# Patient Record
Sex: Female | Born: 1975 | Race: Asian | Hispanic: No | Marital: Married | Smoking: Never smoker
Health system: Southern US, Community
[De-identification: ages and names within clinical notes are randomized; demographics above are authoritative.]

## PROBLEM LIST (undated history)

## (undated) DIAGNOSIS — K219 Gastro-esophageal reflux disease without esophagitis: Secondary | ICD-10-CM

## (undated) DIAGNOSIS — A6000 Herpesviral infection of urogenital system, unspecified: Secondary | ICD-10-CM

## (undated) DIAGNOSIS — IMO0002 Reserved for concepts with insufficient information to code with codable children: Secondary | ICD-10-CM

## (undated) DIAGNOSIS — D62 Acute posthemorrhagic anemia: Secondary | ICD-10-CM

## (undated) DIAGNOSIS — Z9851 Tubal ligation status: Secondary | ICD-10-CM

## (undated) DIAGNOSIS — O24419 Gestational diabetes mellitus in pregnancy, unspecified control: Secondary | ICD-10-CM

## (undated) DIAGNOSIS — O09529 Supervision of elderly multigravida, unspecified trimester: Secondary | ICD-10-CM

## (undated) DIAGNOSIS — D649 Anemia, unspecified: Secondary | ICD-10-CM

## (undated) HISTORY — DX: Anemia, unspecified: D64.9

## (undated) HISTORY — DX: Herpesviral infection of urogenital system, unspecified: A60.00

## (undated) HISTORY — DX: Gestational diabetes mellitus in pregnancy, unspecified control: O24.419

## (undated) HISTORY — DX: Reserved for concepts with insufficient information to code with codable children: IMO0002

## (undated) HISTORY — DX: Gastro-esophageal reflux disease without esophagitis: K21.9

## (undated) HISTORY — PX: LEG SURGERY: SHX1003

## (undated) HISTORY — DX: Supervision of elderly multigravida, unspecified trimester: O09.529

---

## 2009-02-23 ENCOUNTER — Encounter: Admission: RE | Admit: 2009-02-23 | Discharge: 2009-02-23 | Payer: Self-pay | Admitting: Obstetrics & Gynecology

## 2009-05-01 ENCOUNTER — Inpatient Hospital Stay (HOSPITAL_COMMUNITY): Admission: AD | Admit: 2009-05-01 | Discharge: 2009-05-03 | Payer: Self-pay | Admitting: Obstetrics & Gynecology

## 2010-04-04 LAB — CBC
HCT: 28.8 % — ABNORMAL LOW (ref 36.0–46.0)
HCT: 36 % (ref 36.0–46.0)
Hemoglobin: 12 g/dL (ref 12.0–15.0)
Hemoglobin: 9.8 g/dL — ABNORMAL LOW (ref 12.0–15.0)
MCHC: 33.3 g/dL (ref 30.0–36.0)
MCV: 90 fL (ref 78.0–100.0)
MCV: 90.7 fL (ref 78.0–100.0)
Platelets: 261 10*3/uL (ref 150–400)
RDW: 14.6 % (ref 11.5–15.5)
RDW: 15.1 % (ref 11.5–15.5)

## 2010-04-04 LAB — GLUCOSE, CAPILLARY

## 2010-11-08 DIAGNOSIS — E119 Type 2 diabetes mellitus without complications: Secondary | ICD-10-CM | POA: Insufficient documentation

## 2010-12-20 LAB — OB RESULTS CONSOLE HEPATITIS B SURFACE ANTIGEN: Hepatitis B Surface Ag: NEGATIVE

## 2010-12-20 LAB — OB RESULTS CONSOLE RPR: RPR: NONREACTIVE

## 2010-12-20 LAB — OB RESULTS CONSOLE HIV ANTIBODY (ROUTINE TESTING): HIV: NONREACTIVE

## 2010-12-20 LAB — OB RESULTS CONSOLE RUBELLA ANTIBODY, IGM: Rubella: IMMUNE

## 2011-01-05 ENCOUNTER — Emergency Department (HOSPITAL_BASED_OUTPATIENT_CLINIC_OR_DEPARTMENT_OTHER)
Admission: EM | Admit: 2011-01-05 | Discharge: 2011-01-05 | Disposition: A | Discharge status: Home / Self Care | Payer: Managed Care, Other (non HMO) | Attending: Emergency Medicine | Admitting: Emergency Medicine

## 2011-01-05 ENCOUNTER — Encounter: Payer: Self-pay | Admitting: *Deleted

## 2011-01-05 DIAGNOSIS — E860 Dehydration: Secondary | ICD-10-CM

## 2011-01-05 DIAGNOSIS — R197 Diarrhea, unspecified: Secondary | ICD-10-CM | POA: Insufficient documentation

## 2011-01-05 DIAGNOSIS — R1110 Vomiting, unspecified: Secondary | ICD-10-CM

## 2011-01-05 DIAGNOSIS — O211 Hyperemesis gravidarum with metabolic disturbance: Secondary | ICD-10-CM | POA: Insufficient documentation

## 2011-01-05 LAB — CBC
HCT: 36.7 % (ref 36.0–46.0)
Hemoglobin: 12.6 g/dL (ref 12.0–15.0)
MCV: 86.2 fL (ref 78.0–100.0)
WBC: 14.6 10*3/uL — ABNORMAL HIGH (ref 4.0–10.5)

## 2011-01-05 LAB — DIFFERENTIAL
Lymphocytes Relative: 3 % — ABNORMAL LOW (ref 12–46)
Monocytes Absolute: 0.2 10*3/uL (ref 0.1–1.0)
Monocytes Relative: 2 % — ABNORMAL LOW (ref 3–12)
Neutro Abs: 13.9 10*3/uL — ABNORMAL HIGH (ref 1.7–7.7)

## 2011-01-05 LAB — BASIC METABOLIC PANEL
BUN: 12 mg/dL (ref 6–23)
CO2: 24 mEq/L (ref 19–32)
Chloride: 98 mEq/L (ref 96–112)
Creatinine, Ser: 0.5 mg/dL (ref 0.50–1.10)
Glucose, Bld: 134 mg/dL — ABNORMAL HIGH (ref 70–99)

## 2011-01-05 LAB — URINALYSIS, ROUTINE W REFLEX MICROSCOPIC
Bilirubin Urine: NEGATIVE
Glucose, UA: 100 mg/dL — AB
Hgb urine dipstick: NEGATIVE
Protein, ur: NEGATIVE mg/dL
Specific Gravity, Urine: 1.03 (ref 1.005–1.030)

## 2011-01-05 LAB — PREGNANCY, URINE: Preg Test, Ur: POSITIVE

## 2011-01-05 MED ORDER — ONDANSETRON HCL 4 MG/2ML IJ SOLN
4.0000 mg | Freq: Once | INTRAMUSCULAR | Status: AC
  Administered 2011-01-05: 4 mg via INTRAVENOUS
  Filled 2011-01-05: qty 2

## 2011-01-05 MED ORDER — SODIUM CHLORIDE 0.9 % IV BOLUS (SEPSIS)
1000.0000 mL | Freq: Once | INTRAVENOUS | Status: AC
  Administered 2011-01-05: 1000 mL via INTRAVENOUS

## 2011-01-05 MED ORDER — ONDANSETRON 4 MG PO TBDP: 4.0000 mg | ORAL_TABLET | Freq: Three times a day (TID) | ORAL | Status: AC | PRN

## 2011-01-05 MED ORDER — POTASSIUM CHLORIDE 20 MEQ/15ML (10%) PO LIQD
10.0000 meq | Freq: Once | ORAL | Status: AC
  Administered 2011-01-05: 13:00:00 via ORAL
  Filled 2011-01-05: qty 15

## 2011-01-05 NOTE — ED Notes (Signed)
Attempted to find fetal heart tones, unable to locate.  Also noted some discrepancy about EDC.

## 2011-01-05 NOTE — ED Notes (Signed)
Pt reports that yesterday she started having nausea/vomiting and diarrhea.  Pt is pregnant and was seen by her PCP(John Christell Constant) where an CBC showed an elevated WBC of 15.  Pt was sent here for evaluation.

## 2011-01-05 NOTE — ED Provider Notes (Addendum)
History     CSN: [id number]  Arrival date & time 01/05/11  1029   First MD Initiated Contact with Patient 01/05/11 1100      Chief Complaint  Patient presents with  . Nausea  . Diarrhea    (Consider location/radiation/quality/duration/timing/severity/associated sxs/prior treatment) HPI  35yoF G2P1 [redacted]w[redacted]d pregnant by LMP 10/1 pw nausea, vomiting, diarrhea. The patient states her symptoms began yesterday. She is having several episodes of watery diarrhea without blood. She states she also has nausea and vomiting which has been persistent throughout this pregnancy and not atypical. She has not attempted by mouth intake since yesterday secondary to her nausea and dry heaves now. She denies back pain, abdominal pain, vaginal discharge. Denies hematuria/dysuria/freq/urgency. Seen by her primary care doctor this morning there was some concern because her white blood cell count was elevated at that time she was referred to the emergency department for further workup and evaluation. She denies rhinorrhea, nasal congestion, headache, sore throat. She has body aches bilateral legs. Husband and her son with similar illness less than one week ago.    ED Notes, ED Provider Notes from 01/05/11 0000 to 01/05/11 10:53:03       Collier Bullock, RN 01/05/2011 10:46      Pt reports that yesterday she started having nausea/vomiting and diarrhea. Pt is pregnant and was seen by her PCP(John Christell Constant) where an CBC showed an elevated WBC of 15. Pt was sent here for evaluation.     Past Medical History  Diagnosis Date  . Diabetes mellitus     Past Surgical History  Procedure Date  . Leg surgery     History reviewed. No pertinent family history.  History  Substance Use Topics  . Smoking status: Never Smoker   . Smokeless tobacco: Not on file  . Alcohol Use: No    OB History    Grav Para Term Preterm Abortions TAB SAB Ect Mult Living   2 1              Review of Systems except as noted  HPI   Allergies  Review of patient's allergies indicates no known allergies.  Home Medications   Current Outpatient Rx  Name Route Sig Dispense Refill  . FAMOTIDINE 20 MG PO TABS Oral Take 20 mg by mouth 2 (two) times daily.      Marland Kitchen METFORMIN HCL 500 MG PO TABS Oral Take 500 mg by mouth 2 (two) times daily with a meal.        BP 114/74  Pulse 100  Temp(Src) 99.3 F (37.4 C) (Oral)  Resp 15  SpO2 99%  Physical Exam  Nursing note and vitals reviewed. Constitutional: She is oriented to person, place, and time. She appears well-developed.  HENT:  Head: Atraumatic.       Mm dry  Eyes: Conjunctivae and EOM are normal. Pupils are equal, round, and reactive to light.  Neck: Normal range of motion. Neck supple.  Cardiovascular: Regular rhythm, normal heart sounds and intact distal pulses.        tachycardic  Pulmonary/Chest: Effort normal and breath sounds normal. No respiratory distress. She has no wheezes. She has no rales.  Abdominal: Soft. She exhibits no distension. There is no tenderness. There is no rebound and no guarding.  Musculoskeletal: Normal range of motion. She exhibits no edema and no tenderness.  Neurological: She is alert and oriented to person, place, and time.  Skin: Skin is warm and dry. No rash noted.  Psychiatric:  She has a normal mood and affect.    ED Course  Procedures (including critical care time)  Labs Reviewed  URINALYSIS, ROUTINE W REFLEX MICROSCOPIC - Abnormal; Notable for the following:    Glucose, UA 100 (*)    Ketones, ur >80 (*)    All other components within normal limits  CBC - Abnormal; Notable for the following:    WBC 14.6 (*)    All other components within normal limits  DIFFERENTIAL - Abnormal; Notable for the following:    Neutrophils Relative 95 (*)    Neutro Abs 13.9 (*)    Lymphocytes Relative 3 (*)    Lymphs Abs 0.4 (*)    Monocytes Relative 2 (*)    All other components within normal limits  BASIC METABOLIC PANEL -  Abnormal; Notable for the following:    Potassium 3.4 (*)    Glucose, Bld 134 (*)    All other components within normal limits  PREGNANCY, URINE   No results found.   1. Diarrhea   2. Hyperemesis     MDM  Likely viral illness +/- mild hyperemesis. Her abdomen is soft without ttp/r/g. Will check electrolytes, U/A. IVF, zofran. Reassess. Anticipate discharge home.  Patient was able to urinate emergency department. After 1 L of fluid she continues to have greater than 80 ketones in her urine. +Leukocytosis, likely multifactorial-- pregnancy, vomiting, possible viral syndrome. She is tolerating by mouth intake and feeling better at this time. We'll give 2 more liters of IV fluid and she will be discharged home. Bedside ultrasound with IUP, YS, fetal pole. fetal heart rate 182. Family states that her rate was 177 approximately 2 weeks ago.    3:04 PM  patient continues to deny nausea. She tolerating by mouth intake. She has a followup appointment with her OB/GYN and ultrasound scheduled for next week. Patient advised to move up her appointment as needed. Precautions for return to the emergency department.      Forbes Cellar, MD 01/05/11 1505  Forbes Cellar, MD 01/05/11 [phone]

## 2011-01-05 NOTE — ED Notes (Signed)
Pt reports her nausea is better. Still unable to give a urine specimen.

## 2011-01-05 NOTE — ED Notes (Signed)
Pt given soup and crackers to eat

## 2011-01-16 NOTE — L&D Delivery Note (Signed)
Operative Delivery Note At 6:42 AM a viable and healthy female was delivered via .  Presentation: vertex; Position: Right,, Occiput,, Anterior; Station: +4.  Delivery of the head: spontaneous with gentle traction, [place] x one reduced     Second maneuver: ,  mcroberts Third maneuver: , Suprapubic pressure Fourth maneuver: ,  Woods Screw  Verbal consent: obtained from family.  APGAR: 8,9 ; weight pending.   Placenta status: spontaneous,intact .   Cord:  with the following complications: .  Cord pH: na  Anesthesia:  none Episiotomy: none Lacerations: none Suture Repair: na Est. Blood Loss (mL): 300  Mom to postpartum.  Baby to nursery-stable.  Kelly Mullen J 07/19/2011, 6:55 AM

## 2011-07-12 ENCOUNTER — Telehealth (HOSPITAL_COMMUNITY): Payer: Self-pay | Admitting: *Deleted

## 2011-07-12 NOTE — Telephone Encounter (Signed)
Preadmission screen  

## 2011-07-13 ENCOUNTER — Other Ambulatory Visit: Payer: Self-pay | Admitting: Obstetrics & Gynecology

## 2011-07-16 ENCOUNTER — Telehealth (HOSPITAL_COMMUNITY): Payer: Self-pay | Admitting: *Deleted

## 2011-07-16 ENCOUNTER — Encounter (HOSPITAL_COMMUNITY): Payer: Self-pay | Admitting: *Deleted

## 2011-07-16 NOTE — Telephone Encounter (Signed)
Preadmission screen  

## 2011-07-19 ENCOUNTER — Inpatient Hospital Stay (HOSPITAL_COMMUNITY)
Admission: AD | Admit: 2011-07-19 | Discharge: 2011-07-21 | DRG: 775 | Disposition: A | Discharge status: Home / Self Care | Payer: Managed Care, Other (non HMO) | Attending: Obstetrics and Gynecology | Admitting: Obstetrics and Gynecology

## 2011-07-19 ENCOUNTER — Encounter (HOSPITAL_COMMUNITY): Payer: Self-pay | Admitting: *Deleted

## 2011-07-19 DIAGNOSIS — O24419 Gestational diabetes mellitus in pregnancy, unspecified control: Secondary | ICD-10-CM

## 2011-07-19 DIAGNOSIS — D62 Acute posthemorrhagic anemia: Secondary | ICD-10-CM | POA: Diagnosis not present

## 2011-07-19 DIAGNOSIS — O9903 Anemia complicating the puerperium: Secondary | ICD-10-CM | POA: Diagnosis not present

## 2011-07-19 DIAGNOSIS — O99814 Abnormal glucose complicating childbirth: Principal | ICD-10-CM | POA: Diagnosis present

## 2011-07-19 DIAGNOSIS — IMO0002 Reserved for concepts with insufficient information to code with codable children: Secondary | ICD-10-CM | POA: Diagnosis present

## 2011-07-19 HISTORY — DX: Reserved for concepts with insufficient information to code with codable children: IMO0002

## 2011-07-19 HISTORY — DX: Gestational diabetes mellitus in pregnancy, unspecified control: O24.419

## 2011-07-19 LAB — TYPE AND SCREEN: Antibody Screen: NEGATIVE

## 2011-07-19 LAB — CBC
MCV: 82.7 fL (ref 78.0–100.0)
Platelets: 364 10*3/uL (ref 150–400)
RBC: 3.99 MIL/uL (ref 3.87–5.11)
WBC: 12 10*3/uL — ABNORMAL HIGH (ref 4.0–10.5)

## 2011-07-19 LAB — RPR: RPR Ser Ql: NONREACTIVE

## 2011-07-19 MED ORDER — BENZOCAINE-MENTHOL 20-0.5 % EX AERO: 1.0000 "application " | INHALATION_SPRAY | CUTANEOUS | Status: DC | PRN

## 2011-07-19 MED ORDER — DIPHENHYDRAMINE HCL 25 MG PO CAPS: 25.0000 mg | ORAL_CAPSULE | Freq: Four times a day (QID) | ORAL | Status: DC | PRN

## 2011-07-19 MED ORDER — CITRIC ACID-SODIUM CITRATE 334-500 MG/5ML PO SOLN: 30.0000 mL | ORAL | Status: DC | PRN

## 2011-07-19 MED ORDER — LACTATED RINGERS IV SOLN
INTRAVENOUS | Status: DC
  Administered 2011-07-19: 07:00:00 via INTRAVENOUS

## 2011-07-19 MED ORDER — METFORMIN HCL 500 MG PO TABS
500.0000 mg | ORAL_TABLET | Freq: Two times a day (BID) | ORAL | Status: DC
  Filled 2011-07-19 (×3): qty 1

## 2011-07-19 MED ORDER — ACETAMINOPHEN 325 MG PO TABS: 650.0000 mg | ORAL_TABLET | ORAL | Status: DC | PRN

## 2011-07-19 MED ORDER — PRENATAL MULTIVITAMIN CH
1.0000 | ORAL_TABLET | Freq: Every day | ORAL | Status: DC
  Administered 2011-07-19 – 2011-07-20 (×2): 1 via ORAL
  Filled 2011-07-19 (×3): qty 1

## 2011-07-19 MED ORDER — PENICILLIN G POTASSIUM 5000000 UNITS IJ SOLR
2.5000 10*6.[IU] | INTRAMUSCULAR | Status: DC
  Filled 2011-07-19 (×2): qty 2.5

## 2011-07-19 MED ORDER — METHYLERGONOVINE MALEATE 0.2 MG/ML IJ SOLN: 0.2000 mg | INTRAMUSCULAR | Status: DC | PRN

## 2011-07-19 MED ORDER — LANOLIN HYDROUS EX OINT: TOPICAL_OINTMENT | CUTANEOUS | Status: DC | PRN

## 2011-07-19 MED ORDER — ZOLPIDEM TARTRATE 5 MG PO TABS: 5.0000 mg | ORAL_TABLET | Freq: Every evening | ORAL | Status: DC | PRN

## 2011-07-19 MED ORDER — OXYTOCIN BOLUS FROM INFUSION
250.0000 mL | Freq: Once | INTRAVENOUS | Status: DC
  Filled 2011-07-19: qty 500

## 2011-07-19 MED ORDER — FLEET ENEMA 7-19 GM/118ML RE ENEM: 1.0000 | ENEMA | RECTAL | Status: DC | PRN

## 2011-07-19 MED ORDER — ONDANSETRON HCL 4 MG/2ML IJ SOLN: 4.0000 mg | Freq: Four times a day (QID) | INTRAMUSCULAR | Status: DC | PRN

## 2011-07-19 MED ORDER — IBUPROFEN 600 MG PO TABS
600.0000 mg | ORAL_TABLET | Freq: Four times a day (QID) | ORAL | Status: DC | PRN
  Administered 2011-07-19: 600 mg via ORAL
  Filled 2011-07-19: qty 1

## 2011-07-19 MED ORDER — DIBUCAINE 1 % RE OINT: 1.0000 "application " | TOPICAL_OINTMENT | RECTAL | Status: DC | PRN

## 2011-07-19 MED ORDER — ONDANSETRON HCL 4 MG PO TABS: 4.0000 mg | ORAL_TABLET | ORAL | Status: DC | PRN

## 2011-07-19 MED ORDER — ONDANSETRON HCL 4 MG/2ML IJ SOLN: 4.0000 mg | INTRAMUSCULAR | Status: DC | PRN

## 2011-07-19 MED ORDER — SENNOSIDES-DOCUSATE SODIUM 8.6-50 MG PO TABS
2.0000 | ORAL_TABLET | Freq: Every day | ORAL | Status: DC
  Administered 2011-07-19 – 2011-07-20 (×2): 2 via ORAL

## 2011-07-19 MED ORDER — SIMETHICONE 80 MG PO CHEW: 80.0000 mg | CHEWABLE_TABLET | ORAL | Status: DC | PRN

## 2011-07-19 MED ORDER — OXYTOCIN 40 UNITS IN LACTATED RINGERS INFUSION - SIMPLE MED
62.5000 mL/h | Freq: Once | INTRAVENOUS | Status: AC
  Administered 2011-07-19: 62.5 mL/h via INTRAVENOUS
  Filled 2011-07-19: qty 1000

## 2011-07-19 MED ORDER — OXYCODONE-ACETAMINOPHEN 5-325 MG PO TABS
1.0000 | ORAL_TABLET | ORAL | Status: DC | PRN
  Administered 2011-07-20 – 2011-07-21 (×2): 1 via ORAL
  Filled 2011-07-19 (×3): qty 1

## 2011-07-19 MED ORDER — LACTATED RINGERS IV SOLN: 500.0000 mL | INTRAVENOUS | Status: DC | PRN

## 2011-07-19 MED ORDER — OXYCODONE-ACETAMINOPHEN 5-325 MG PO TABS: 1.0000 | ORAL_TABLET | ORAL | Status: DC | PRN

## 2011-07-19 MED ORDER — METHYLERGONOVINE MALEATE 0.2 MG PO TABS: 0.2000 mg | ORAL_TABLET | ORAL | Status: DC | PRN

## 2011-07-19 MED ORDER — TETANUS-DIPHTH-ACELL PERTUSSIS 5-2.5-18.5 LF-MCG/0.5 IM SUSP
0.5000 mL | Freq: Once | INTRAMUSCULAR | Status: AC
  Administered 2011-07-20: 0.5 mL via INTRAMUSCULAR
  Filled 2011-07-19: qty 0.5

## 2011-07-19 MED ORDER — LIDOCAINE HCL (PF) 1 % IJ SOLN
INTRAMUSCULAR | Status: AC
  Filled 2011-07-19: qty 30

## 2011-07-19 MED ORDER — PENICILLIN G POTASSIUM 5000000 UNITS IJ SOLR
5.0000 10*6.[IU] | Freq: Once | INTRAVENOUS | Status: DC
  Filled 2011-07-19: qty 5

## 2011-07-19 MED ORDER — LIDOCAINE HCL (PF) 1 % IJ SOLN: 30.0000 mL | INTRAMUSCULAR | Status: DC | PRN

## 2011-07-19 MED ORDER — IBUPROFEN 600 MG PO TABS
600.0000 mg | ORAL_TABLET | Freq: Four times a day (QID) | ORAL | Status: DC
  Administered 2011-07-19 – 2011-07-21 (×8): 600 mg via ORAL
  Filled 2011-07-19 (×8): qty 1

## 2011-07-19 MED ORDER — WITCH HAZEL-GLYCERIN EX PADS
1.0000 "application " | MEDICATED_PAD | CUTANEOUS | Status: DC | PRN
  Administered 2011-07-19: 1 via TOPICAL

## 2011-07-19 NOTE — Progress Notes (Signed)
Kelly Mullen is a 36 y.o. G2P1001 at [redacted]w[redacted]d by LMP admitted for active labor  Subjective: pushing  Objective: BP 141/78  Pulse 88  Temp 98.2 F (36.8 C) (Oral)  Resp 20  Ht 5\' 2"  (1.575 m)  Wt 71.215 kg (157 lb)  BMI 28.72 kg/m2  LMP 10/16/2010      FHT:  FHR: 155 bpm, variability: moderate,  accelerations:  Present,  decelerations:  Absent UC:   regular, every 2 minutes SVE:   FD/ +2  Labs: Lab Results  Component Value Date   WBC 14.6* 01/05/2011   HGB 12.6 01/05/2011   HCT 36.7 01/05/2011   MCV 86.2 01/05/2011   PLT 303 01/05/2011    Assessment / Plan: Spontaneous labor, progressing normally  Labor: Progressing normally Preeclampsia:  na Fetal Wellbeing:  Category I Pain Control:  Labor support without medications I/D:  n/a Anticipated MOD:  NSVD  Ronasia Isola J 07/19/2011, 6:59 AM

## 2011-07-19 NOTE — MAU Note (Signed)
Patient and her husband are adamant that Dr. Juliene Pina be called and present for delivery. According to the husband this was not discussed with Dr. Juliene Pina but they are insistent that she be called unless she is out of the state.  However Dr. Juliene Pina is not on call tonight. I have explained to the patient and her husband that I will do my best to get in touch with her. I will however call Dr. Billy Coast who on call for the practice for orders on how to proceed.

## 2011-07-19 NOTE — Progress Notes (Signed)
Pt arrived at 0627 via stretcher, pt screaming and yelling tht she has to push. MWilliams CNM in, checked cervix 10cm. Husband requesting a medical doctor, Dr Emelda Fear in room, introduced self. Awaiting Dr Billy Coast who was called by MAU. Pt hasnt been taking valtrex. Perinieum clear. Dr Billy Coast arrived, she doc flowsheet charting

## 2011-07-19 NOTE — MAU Note (Signed)
PT SAYS HURT BAD AT 0430-   WAS 2 CM IN OFFICE.   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   DENIES MRSA.

## 2011-07-19 NOTE — MAU Note (Signed)
Dr. Billy Coast notified this patient has present for a labor eval. SVE 4/80/-1. Contracting every 2 minutes on the monitor. GBS positive, HSV positive will no outbreaks and not on meds. GDM on glyburide.  Patient requesting epidural. Also notified that patient and her husband are adamant about Dr. Juliene Pina being contacted and present to care for them. Dr. Billy Coast will try and get in contact with Dr. Juliene Pina. Orders given to admit to birthing suites, epidural prn, and penicillin per GBS protocol.

## 2011-07-20 LAB — CBC
HCT: 29.7 % — ABNORMAL LOW (ref 36.0–46.0)
MCHC: 32.7 g/dL (ref 30.0–36.0)
Platelets: 308 10*3/uL (ref 150–400)
RDW: 13.6 % (ref 11.5–15.5)

## 2011-07-20 MED ORDER — POLYSACCHARIDE IRON COMPLEX 150 MG PO CAPS
150.0000 mg | ORAL_CAPSULE | Freq: Every day | ORAL | Status: DC
  Administered 2011-07-20: 150 mg via ORAL
  Filled 2011-07-20 (×2): qty 1

## 2011-07-20 NOTE — Progress Notes (Signed)
Patient ID: Kelly Mullen, female   DOB: 10-24-1975, 36 y.o.   MRN: [id number] PPD # 1  Subjective: Pt reports feeling well/ Pain controlled with prescription NSAID's including motrin Tolerating po/ Voiding without problems/ No n/v Bleeding is light Newborn info:  Information for the patient's newborn:  Kattia, Selley [[id number]]  female Feeding: breast   Objective:  VS: Blood pressure 112/76, pulse 73, temperature 97.2 F (36.2 C), temperature source Oral, resp. rate 18.    Basename 07/20/11 0525 07/19/11 0635  WBC 11.6* 12.0*  HGB 9.7* 10.8*  HCT 29.7* 33.0*  PLT 308 364    Blood type: --/--/AB POS, AB POS (07/04 7829) Rubella: Immune (12/05 0000)    Physical Exam:  General: A & O x 3  alert, cooperative and no distress CV: Regular rate and rhythm Resp: clear Abdomen: soft, nontender, normal bowel sounds Uterine Fundus: firm, below umbilicus, nontender Perineum: intact; mild edema Lochia: minimal Ext: edema trace and Homans sign is negative, no sign of DVT   A/P: PPD # 1/ G2P2002/ S/P:spontaneous vaginal delivery (precipitous) Hx GDMA2; stable Mild ABL anemia; will start fe supplement Doing well Continue routine post partum orders Anticipate D/C home in AM    Demetrius Revel, MSN, [facility] 07/20/2011, 10:27 AM

## 2011-07-21 MED ORDER — IBUPROFEN 600 MG PO TABS: 600.0000 mg | ORAL_TABLET | Freq: Four times a day (QID) | ORAL | Status: AC

## 2011-07-21 MED ORDER — POLYSACCHARIDE IRON COMPLEX 150 MG PO CAPS: 150.0000 mg | ORAL_CAPSULE | Freq: Every day | ORAL | Status: DC

## 2011-07-21 NOTE — Discharge Summary (Signed)
Obstetric Discharge Summary Reason for Admission: onset of labor and gestational diabetes A2 Prenatal Procedures: NST and ultrasound Intrapartum Procedures: spontaneous vaginal delivery, GBS prophylaxis and shoulder dystocia Postpartum Procedures: TDaP vaccine Complications-Operative and Postpartum: none Hemoglobin  Date Value Range Status  07/20/2011 9.7* 12.0 - 15.0 g/dL Final     HCT  Date Value Range Status  07/20/2011 29.7* 36.0 - 46.0 % Final    Physical Exam:  General: alert, cooperative and no distress Lochia: appropriate Uterine Fundus: firm Incision: none  DVT Evaluation: No evidence of DVT seen on physical exam. No significant calf/ankle edema.  Discharge Diagnoses: Term Pregnancy-delivered Chronic and acute blood loss anemia, stable  Discharge Information: Date: 07/21/2011 Activity: pelvic rest Diet: routine Medications: PNV, Ibuprofen and Iron Condition: stable Instructions: refer to practice specific booklet Discharge to: home Follow-up Information    Follow up with MODY,VAISHALI R, MD. Schedule an appointment as soon as possible for a visit in 6 weeks.   Contact information:   [address] [place] [address] [phone]          Newborn Data: Live born female  Birth Weight: 8 lb 8.5 oz (3870 g) APGAR: 8, 10  Home with mother.  Olanrewaju Osborn 07/21/2011, 11:01 AM

## 2011-07-21 NOTE — Progress Notes (Signed)
Post Partum Day #2            Information for the patient's newborn:  Raveena, Hebdon [[id number]]  female   Feeding: breast  Subjective: No HA, SOB, CP, F/C, breast symptoms. Pain minimal. Normal vaginal bleeding, no clots.      Objective:  Temp:  [97.9 F (36.6 C)-98.1 F (36.7 C)] 97.9 F (36.6 C) (07/06 0600) Pulse Rate:  [72-77] 72  (07/06 0600) Resp:  [18-20] 18  (07/06 0600) BP: (103-128)/(67-83) 112/75 mmHg (07/06 0600) SpO2:  [97 %] 97 % (07/05 2052)  No intake or output data in the 24 hours ending 07/21/11 1051     Basename 07/20/11 0525 07/19/11 0635  WBC 11.6* 12.0*  HGB 9.7* 10.8*  HCT 29.7* 33.0*  PLT 308 364    Blood type: --/--/AB POS, AB POS (07/04 8657) Rubella: Immune (12/05 0000)    Physical Exam:  General: alert, cooperative and no distress Uterine Fundus: firm, U-1 Lochia: scant Perineum: intact, edema none DVT Evaluation: Negative Homan's sign. No significant calf/ankle edema.    Assessment/Plan: PPD # 2 / 36 y.o., [id number] S/P:spontaneous vaginal   Principal Problem:  *Postpartum care following vaginal delivery (7/4) Active Problems:  GDM, class A2 (delivered)  Maternal anemia complicating pregnancy, childbirth, or the puerperium    normal postpartum exam  Continue current postpartum care  D/C home   LOS: 2 days   Efrat Zuidema, CNM, MSN 07/21/2011, 10:51 AM

## 2011-07-21 NOTE — Discharge Summary (Signed)
Agree with note and plan V.Nora Sabey, MD 

## 2011-07-26 ENCOUNTER — Inpatient Hospital Stay (HOSPITAL_COMMUNITY): Admission: RE | Admit: 2011-07-26 | Payer: Managed Care, Other (non HMO) | Source: Ambulatory Visit

## 2011-09-04 NOTE — H&P (Signed)
NAMEDWIGHT, BURDO NO.:  [id number]  MEDICAL RECORD NO.:  [id number]  LOCATION:  9144                          FACILITY:  [facility]  PHYSICIAN:  Lenoard Aden, M.D.DATE OF BIRTH:  23-Nov-1975  DATE OF ADMISSION:  07/19/2011 DATE OF DISCHARGE:  07/21/2011                             HISTORY & PHYSICAL   CHIEF COMPLAINT:  Labor.  HISTORY OF PRESENT ILLNESS:  A 36 year old white female, G2, P1, in active labor.  PAST MEDICAL HISTORY:  Noncontributory.  SURGICAL HISTORY:  Noncontributory.  Previous history of a vaginal delivery.  Prenatal course uncomplicated.  PHYSICAL EXAMINATION:  GENERAL:  She is a well-developed, well- nourished, white female, in no acute distress. HEENT:  Normal. NECK:  Supple.  Full range of motion. LUNGS:  Clear. HEART:  Regular rhythm. ABDOMEN:  Soft, gravid, nontender.  Cervical exam reveals __________. EXTREMITIES:  There are no cords. NEUROLOGIC:  Nonfocal. SKIN:  Intact.  IMPRESSION:  __________ labor __________ vaginal delivery.  __________ dictated.     Lenoard Aden, M.D.     RJT/MEDQ  D:  09/04/2011  T:  09/04/2011  Job:  [id number]

## 2012-10-28 ENCOUNTER — Encounter (HOSPITAL_COMMUNITY): Payer: Self-pay | Admitting: Emergency Medicine

## 2012-10-28 ENCOUNTER — Inpatient Hospital Stay (HOSPITAL_COMMUNITY)
Admission: EM | Admit: 2012-10-28 | Discharge: 2012-10-30 | DRG: 603 | Disposition: A | Discharge status: Home / Self Care | Payer: Managed Care, Other (non HMO) | Attending: Internal Medicine | Admitting: Internal Medicine

## 2012-10-28 ENCOUNTER — Emergency Department (HOSPITAL_COMMUNITY): Payer: Managed Care, Other (non HMO)

## 2012-10-28 DIAGNOSIS — A6000 Herpesviral infection of urogenital system, unspecified: Secondary | ICD-10-CM | POA: Diagnosis present

## 2012-10-28 DIAGNOSIS — D649 Anemia, unspecified: Secondary | ICD-10-CM

## 2012-10-28 DIAGNOSIS — A4901 Methicillin susceptible Staphylococcus aureus infection, unspecified site: Secondary | ICD-10-CM | POA: Diagnosis present

## 2012-10-28 DIAGNOSIS — Z79899 Other long term (current) drug therapy: Secondary | ICD-10-CM

## 2012-10-28 DIAGNOSIS — L02619 Cutaneous abscess of unspecified foot: Principal | ICD-10-CM | POA: Diagnosis present

## 2012-10-28 DIAGNOSIS — K219 Gastro-esophageal reflux disease without esophagitis: Secondary | ICD-10-CM | POA: Diagnosis present

## 2012-10-28 DIAGNOSIS — IMO0001 Reserved for inherently not codable concepts without codable children: Secondary | ICD-10-CM | POA: Diagnosis present

## 2012-10-28 DIAGNOSIS — Z8719 Personal history of other diseases of the digestive system: Secondary | ICD-10-CM

## 2012-10-28 DIAGNOSIS — E119 Type 2 diabetes mellitus without complications: Secondary | ICD-10-CM | POA: Diagnosis present

## 2012-10-28 DIAGNOSIS — L03119 Cellulitis of unspecified part of limb: Secondary | ICD-10-CM | POA: Diagnosis present

## 2012-10-28 LAB — CBC WITH DIFFERENTIAL/PLATELET
Eosinophils Absolute: 0.7 10*3/uL (ref 0.0–0.7)
Lymphs Abs: 2.9 10*3/uL (ref 0.7–4.0)
MCH: 29.4 pg (ref 26.0–34.0)
Neutrophils Relative %: 56 % (ref 43–77)
Platelets: 330 10*3/uL (ref 150–400)
RBC: 4.25 MIL/uL (ref 3.87–5.11)
WBC: 9.5 10*3/uL (ref 4.0–10.5)

## 2012-10-28 LAB — COMPREHENSIVE METABOLIC PANEL
ALT: 10 U/L (ref 0–35)
Albumin: 4 g/dL (ref 3.5–5.2)
Alkaline Phosphatase: 63 U/L (ref 39–117)
GFR calc Af Amer: >90 mL/min (ref >90)
Glucose, Bld: 118 mg/dL — ABNORMAL HIGH (ref 70–99)
Potassium: 4.2 mEq/L (ref 3.5–5.1)
Sodium: 138 mEq/L (ref 135–145)
Total Protein: 8.3 g/dL (ref 6.0–8.3)

## 2012-10-28 MED ORDER — VANCOMYCIN HCL IN DEXTROSE 1-5 GM/200ML-% IV SOLN
1000.0000 mg | Freq: Once | INTRAVENOUS | Status: AC
  Administered 2012-10-29: 1000 mg via INTRAVENOUS
  Filled 2012-10-28: qty 200

## 2012-10-28 NOTE — ED Provider Notes (Signed)
CSN: [id number]     Arrival date & time 10/28/12  2139 History   First MD Initiated Contact with Patient 10/28/12 2301     Chief Complaint  Patient presents with  . Foot Swelling  . Leg Swelling   (Consider location/radiation/quality/duration/timing/severity/associated sxs/prior Treatment) HPI HX per PT - PCP [facility].  3 days ago noticed painful red bump medial aspect of left foot. It has been getting worse since onset. She was evaluated by PCP yesterday had injection of ABx and started on clindamycin. Despite ABx has worsening pain, swelling and redness today and now streaking up to just below her left knee. Her PCP referred her to the ER. No fevers, no emesis. PT unsure how infection started but believes that she was bit by something. No h/o same. Is diabetic and blood sugars are WNL.   LMP over 4 weeks ago - PT and her husband are trying to get pregnant. Neg home preg test yesterday  Past Medical History  Diagnosis Date  . Diabetes mellitus   . Gestational diabetes     glyburide  . AMA (advanced maternal age) multigravida 35+   . Anemia   . Genital herpes, unspecified   . Abnormal Pap smear   . GERD (gastroesophageal reflux disease)   . Postpartum care following vaginal delivery (7/4) 07/19/2011  . GDM, class A2 (delivered) 07/19/2011  . Maternal anemia complicating pregnancy, childbirth, or the puerperium 07/19/2011   Past Surgical History  Procedure Laterality Date  . Leg surgery      faciatomy bilateral legs   Family History  Problem Relation Age of Onset  . Diabetes Brother   . Diabetes Paternal Aunt   . Diabetes Paternal Grandmother    History  Substance Use Topics  . Smoking status: Never Smoker   . Smokeless tobacco: Never Used  . Alcohol Use: No   OB History   Grav Para Term Preterm Abortions TAB SAB Ect Mult Living   2 2 2       2      Review of Systems  Constitutional: Negative for fever and chills.  Respiratory: Negative for shortness of breath.    Cardiovascular: Negative for chest pain.  Gastrointestinal: Negative for vomiting and abdominal pain.  Genitourinary: Negative for dysuria.  Musculoskeletal: Negative for back pain, neck pain and neck stiffness.  Skin: Positive for rash and wound.  Neurological: Negative for headaches.  All other systems reviewed and are negative.    Allergies  Other and Tape  Home Medications   Current Outpatient Rx  Name  Route  Sig  Dispense  Refill  . CefTRIAXone Sodium (ROCEPHIN IJ)   Intramuscular   Inject 1 each into the muscle once.         . clindamycin (CLEOCIN) 300 MG capsule   Oral   Take 300 mg by mouth 4 (four) times daily.         . folic acid (FOLVITE) 400 MCG tablet   Oral   Take 400 mcg by mouth daily.         Marland Kitchen guaiFENesin (ROBITUSSIN) 100 MG/5ML liquid   Oral   Take 200 mg by mouth 3 (three) times daily as needed for cough.         . metFORMIN (GLUCOPHAGE) 1000 MG tablet   Oral   Take 1,000 mg by mouth 2 (two) times daily with a meal.          BP 111/77  Pulse 85  Temp(Src) 98.2 F (36.8 C) (  Oral)  Resp 16  Ht 5\' 2"  (1.575 m)  Wt 130 lb (58.968 kg)  BMI 23.77 kg/m2  SpO2 99%  LMP 09/29/2012 Physical Exam  Constitutional: She is oriented to person, place, and time. She appears well-developed and well-nourished.  HENT:  Head: Normocephalic and atraumatic.  Eyes: Conjunctivae and EOM are normal. Pupils are equal, round, and reactive to light.  Neck: Neck supple.  Cardiovascular: Normal rate, regular rhythm and intact distal pulses.   Pulmonary/Chest: Effort normal and breath sounds normal. No respiratory distress.  Abdominal: Soft. She exhibits no distension. There is no tenderness.  Musculoskeletal: Normal range of motion.  LLE with small area of purulence but no underlying fluctuance medial aspect of foot with surrounding tenderness, warmth and swelling that extends to entire dorsum of foot with streaking up to proximal tibia with a second area  of streaking erythema medial thigh  Neurological: She is alert and oriented to person, place, and time.  Skin: Skin is warm and dry.    ED Course  INCISION AND DRAINAGE Date/Time: 10/29/2012 12:35 AM Performed by: Sunnie Nielsen Authorized by: Sunnie Nielsen Consent: Verbal consent obtained. Risks and benefits: risks, benefits and alternatives were discussed Consent given by: patient Patient understanding: patient states understanding of the procedure being performed Patient consent: the patient's understanding of the procedure matches consent given Procedure consent: procedure consent matches procedure scheduled Required items: required blood products, implants, devices, and special equipment available Patient identity confirmed: verbally with patient Type: abscess Body area: lower extremity Location details: left foot Anesthesia: local infiltration Local anesthetic: lidocaine 1% without epinephrine Anesthetic total: 2 ml Scalpel size: 11 Needle gauge: 20 Incision type: single straight Complexity: simple Drainage: serosanguinous Drainage amount: scant Wound treatment: wound left open Patient tolerance: Patient tolerated the procedure well with no immediate complications.   (including critical care time) Labs Review Labs Reviewed  CBC WITH DIFFERENTIAL - Abnormal; Notable for the following:    Eosinophils Relative 7 (*)    All other components within normal limits  COMPREHENSIVE METABOLIC PANEL - Abnormal; Notable for the following:    Glucose, Bld 118 (*)    Total Bilirubin 0.2 (*)    All other components within normal limits  WOUND CULTURE  HCG, QUANTITATIVE, PREGNANCY   Imaging Review Dg Foot Complete Left  10/28/2012   CLINICAL DATA:  Infected insect bite.  EXAM: LEFT FOOT - COMPLETE 3+ VIEW  COMPARISON:  None.  FINDINGS: Soft tissue swelling medially. No osseous abnormality.  IMPRESSION: No signs of osteomyelitis or foreign body. Soft tissue swelling medially.    Electronically Signed   By: Davonna Belling M.D.   On: 10/28/2012 22:41    IV ABx pending serum hCG MED consult DR Julian Reil evaluated for admit  MDM  Dx: Cellulitis L foot Labs VS reassuring, is getting worse despite outpatient ABx MED consulted   Sunnie Nielsen, MD 10/29/12 [phone]

## 2012-10-28 NOTE — ED Notes (Signed)
Patient said she was feeding her dogs Thursday and she noticed something had bitten her on her left foot.  She said she had a pedicure on Friday and did not notice anything on her foot.  Sunday night about 0400 she noticed a quarter size spot where she had been bitten and it hurt to walk on that foot.  Monday morning, she set an appointment for 1500hrs.  The PCP looked at it and at that time her right foot was swollen, and it had a red streak up to her ankle.  She was given Rocephin IM and clindamycin by mouth.  She was advised it would get better and it got worse so she called the after hours number for her PCP and she was advised to come to the ED.

## 2012-10-28 NOTE — ED Notes (Signed)
Pt. reports progressing left foot and left lower leg swelling onset 2 days ago , denies injury , small abscess at left medial foot noted with no drainage .

## 2012-10-29 ENCOUNTER — Encounter (HOSPITAL_COMMUNITY): Payer: Self-pay | Admitting: *Deleted

## 2012-10-29 DIAGNOSIS — Z8719 Personal history of other diseases of the digestive system: Secondary | ICD-10-CM

## 2012-10-29 DIAGNOSIS — D649 Anemia, unspecified: Secondary | ICD-10-CM | POA: Diagnosis not present

## 2012-10-29 DIAGNOSIS — L02619 Cutaneous abscess of unspecified foot: Secondary | ICD-10-CM | POA: Diagnosis present

## 2012-10-29 DIAGNOSIS — E119 Type 2 diabetes mellitus without complications: Secondary | ICD-10-CM | POA: Diagnosis present

## 2012-10-29 LAB — BASIC METABOLIC PANEL
Calcium: 9 mg/dL (ref 8.4–10.5)
GFR calc non Af Amer: >90 mL/min (ref >90)
Glucose, Bld: 122 mg/dL — ABNORMAL HIGH (ref 70–99)
Potassium: 3.8 mEq/L (ref 3.5–5.1)
Sodium: 137 mEq/L (ref 135–145)

## 2012-10-29 LAB — GLUCOSE, CAPILLARY: Glucose-Capillary: 156 mg/dL — ABNORMAL HIGH (ref 70–99)

## 2012-10-29 LAB — CBC
Hemoglobin: 11.6 g/dL — ABNORMAL LOW (ref 12.0–15.0)
MCH: 29.4 pg (ref 26.0–34.0)
MCHC: 34.2 g/dL (ref 30.0–36.0)
WBC: 9 10*3/uL (ref 4.0–10.5)

## 2012-10-29 MED ORDER — IBUPROFEN 400 MG PO TABS
400.0000 mg | ORAL_TABLET | Freq: Three times a day (TID) | ORAL | Status: DC
  Administered 2012-10-29 – 2012-10-30 (×4): 400 mg via ORAL
  Filled 2012-10-29 (×6): qty 1

## 2012-10-29 MED ORDER — METFORMIN HCL 500 MG PO TABS
1000.0000 mg | ORAL_TABLET | Freq: Every day | ORAL | Status: DC
  Administered 2012-10-29: 1000 mg via ORAL
  Filled 2012-10-29 (×2): qty 2

## 2012-10-29 MED ORDER — METFORMIN HCL 500 MG PO TABS
1000.0000 mg | ORAL_TABLET | Freq: Two times a day (BID) | ORAL | Status: DC
  Filled 2012-10-29 (×3): qty 2

## 2012-10-29 MED ORDER — FOLIC ACID 0.5 MG HALF TAB
400.0000 ug | ORAL_TABLET | Freq: Every day | ORAL | Status: DC
  Administered 2012-10-29 – 2012-10-30 (×2): 0.5 mg via ORAL
  Filled 2012-10-29 (×2): qty 1

## 2012-10-29 MED ORDER — VANCOMYCIN HCL IN DEXTROSE 750-5 MG/150ML-% IV SOLN
750.0000 mg | Freq: Two times a day (BID) | INTRAVENOUS | Status: DC
  Administered 2012-10-29 – 2012-10-30 (×3): 750 mg via INTRAVENOUS
  Filled 2012-10-29 (×5): qty 150

## 2012-10-29 MED ORDER — ENOXAPARIN SODIUM 40 MG/0.4ML ~~LOC~~ SOLN
40.0000 mg | SUBCUTANEOUS | Status: DC
  Filled 2012-10-29: qty 0.4

## 2012-10-29 NOTE — ED Notes (Signed)
Pt informed of plan of care. Family at bedside

## 2012-10-29 NOTE — H&P (Signed)
[facility] History and Physical  Jawana Reagor ZOX:[id number] DOB: September 27, 1975 DOA: 10/28/2012  Referring physician: ED PCP: No primary provider on file.  Chief Complaint: cellulitis  HPI: Kelly Mullen is a 37 y.o. female who presents to the ED.  3 days ago she noticed a painful red bump on medial aspect of left foot, thought it was a spider bite.  Has been getting worse since onset.  Evaluated by PCP yesterday and had IM rocephin and started on clindamycin.  Today she has erythema over the entire dorsal aspect of her foot and lymphangitis running up the inside of her leg.  PCP sent patient to ED for admission and IV abx.  Review of Systems: 12 systems reviewed and otherwise negative.  Past Medical History  Diagnosis Date  . Diabetes mellitus   . Gestational diabetes     glyburide  . AMA (advanced maternal age) multigravida 35+   . Anemia   . Genital herpes, unspecified   . Abnormal Pap smear   . GERD (gastroesophageal reflux disease)   . Postpartum care following vaginal delivery (7/4) 07/19/2011  . GDM, class A2 (delivered) 07/19/2011  . Maternal anemia complicating pregnancy, childbirth, or the puerperium 07/19/2011   Past Surgical History  Procedure Laterality Date  . Leg surgery      faciatomy bilateral legs   Social History:  reports that she has never smoked. She has never used smokeless tobacco. She reports that she does not drink alcohol or use illicit drugs.   Allergies  Allergen Reactions  . Other Hives    "nutella-chocolate hazelnut spread"  . Tape Itching and Rash    Hypoallergenic everything    Family History  Problem Relation Age of Onset  . Diabetes Brother   . Diabetes Paternal Aunt   . Diabetes Paternal Grandmother     Prior to Admission medications   Medication Sig Start Date End Date Taking? Authorizing Provider  CefTRIAXone Sodium (ROCEPHIN IJ) Inject 1 each into the muscle once.   Yes Historical Provider, MD  clindamycin (CLEOCIN) 300 MG  capsule Take 300 mg by mouth 4 (four) times daily.   Yes Historical Provider, MD  folic acid (FOLVITE) 400 MCG tablet Take 400 mcg by mouth daily.   Yes Historical Provider, MD  guaiFENesin (ROBITUSSIN) 100 MG/5ML liquid Take 200 mg by mouth 3 (three) times daily as needed for cough.   Yes Historical Provider, MD  metFORMIN (GLUCOPHAGE) 1000 MG tablet Take 1,000 mg by mouth 2 (two) times daily with a meal.   Yes Historical Provider, MD   Physical Exam: Filed Vitals:   10/29/12 0045  BP: 90/57  Pulse: 92  Temp:   Resp:      General:  NAD, resting comfortably in bed  Eyes: PEERLA EOMI  ENT: mucous membranes moist  Neck: supple w/o JVD  Cardiovascular: RRR w/o MRG  Respiratory: CTA B  Abdomen: soft, nt, nd, bs+  Skin: small pustule on medial aspect of arch of her foot, erythema surrounding and across the dorsal surface of foot, erythema also streaks up leg  Musculoskeletal: MAE, full ROM all 4 extremities  Psychiatric: normal tone and affect  Neurologic: AAOx3, grossly non-focal   Labs on Admission:  Basic Metabolic Panel:  Recent Labs Lab 10/28/12 2155  NA 138  K 4.2  CL 103  CO2 23  GLUCOSE 118*  BUN 12  CREATININE 0.60  CALCIUM 9.5   Liver Function Tests:  Recent Labs Lab 10/28/12 2155  AST 14  ALT 10  ALKPHOS 63  BILITOT 0.2*  PROT 8.3  ALBUMIN 4.0   No results found for this basename: LIPASE, AMYLASE,  in the last 168 hours No results found for this basename: AMMONIA,  in the last 168 hours CBC:  Recent Labs Lab 10/28/12 2155  WBC 9.5  NEUTROABS 5.3  HGB 12.5  HCT 36.6  MCV 86.1  PLT 330   Cardiac Enzymes: No results found for this basename: CKTOTAL, CKMB, CKMBINDEX, TROPONINI,  in the last 168 hours  BNP (last 3 results) No results found for this basename: PROBNP,  in the last 8760 hours CBG: No results found for this basename: GLUCAP,  in the last 168 hours  Radiological Exams on Admission: Dg Foot Complete Left  10/28/2012    CLINICAL DATA:  Infected insect bite.  EXAM: LEFT FOOT - COMPLETE 3+ VIEW  COMPARISON:  None.  FINDINGS: Soft tissue swelling medially. No osseous abnormality.  IMPRESSION: No signs of osteomyelitis or foreign body. Soft tissue swelling medially.   Electronically Signed   By: Davonna Belling M.D.   On: 10/28/2012 22:41    EKG: Independently reviewed.  Assessment/Plan Principal Problem:   Cellulitis and abscess of foot excluding toe   1. Cellulitis and abscess of left foot - EDP drained abscess and sent sample for culture, patient started on vancomycin, admitting for observation given extensive lymphangitis associated with this. 2. DM2 - continue patient on home metformin    Code Status: Full (must indicate code status--if unknown or must be presumed, indicate so) Family Communication: Husband at bedside (indicate person spoken with, if applicable, with phone number if by telephone) Disposition Plan: Admit to obs (indicate anticipated LOS)  Time spent: 50 min  GARDNER, JARED M. [facility] Pager [phone]  If 7PM-7AM, please contact night-coverage [email or web address] Password [email or web address] 10/29/2012, 1:46 AM

## 2012-10-29 NOTE — Progress Notes (Signed)
ANTIBIOTIC CONSULT NOTE - INITIAL  Pharmacy Consult for vancomycin Indication: cellulitis  Allergies  Allergen Reactions  . Other Hives    "nutella-chocolate hazelnut spread"  . Tape Itching and Rash    Hypoallergenic everything    Patient Measurements: Height: 5\' 2"  (157.5 cm) Weight: 130 lb (58.968 kg) IBW/kg (Calculated) : 50.1   Vital Signs: Temp: 98.2 F (36.8 C) (10/14 2242) Temp src: Oral (10/14 2242) BP: 111/77 mmHg (10/14 2242) Pulse Rate: 85 (10/14 2242) Intake/Output from previous day:   Intake/Output from this shift:    Labs:  Recent Labs  10/28/12 2155  WBC 9.5  HGB 12.5  PLT 330  CREATININE 0.60   Estimated Creatinine Clearance: 76.2 ml/min (by C-G formula based on Cr of 0.6). No results found for this basename: VANCOTROUGH, VANCOPEAK, VANCORANDOM, GENTTROUGH, GENTPEAK, GENTRANDOM, TOBRATROUGH, TOBRAPEAK, TOBRARND, AMIKACINPEAK, AMIKACINTROU, AMIKACIN,  in the last 72 hours   Microbiology: No results found for this or any previous visit (from the past 720 hour(s)).  Medical History: Past Medical History  Diagnosis Date  . Diabetes mellitus   . Gestational diabetes     glyburide  . AMA (advanced maternal age) multigravida 35+   . Anemia   . Genital herpes, unspecified   . Abnormal Pap smear   . GERD (gastroesophageal reflux disease)   . Postpartum care following vaginal delivery (7/4) 07/19/2011  . GDM, class A2 (delivered) 07/19/2011  . Maternal anemia complicating pregnancy, childbirth, or the puerperium 07/19/2011    Medications:   (Not in a hospital admission) Assessment: 37 yo lady to start IV vancomycin for foot cellulitis that did not respond to po antibiotics.  Vancomycin 1 gm ordered in ED.  Goal of Therapy:  Vancomycin trough level 10-15 mcg/ml  Plan:  Vancomycin 750 mg IV q12 hours. F/u renal function, clinical course and cultures.  Marcelle Bebout Poteet 10/29/2012,12:34 AM

## 2012-10-29 NOTE — Progress Notes (Addendum)
[facility] PROGRESS NOTE  Martrice Apt ZOX:[id number] DOB: April 17, 1975 DOA: 10/28/2012 PCP: Vivien Presto, MD Primary Endocrinologist: Dr. Dorisann Frames  HPI/Brief narrative 37 year old female patient with history of type II DM, anemia and GERD was admitted on 10/29/12 with 3 days history of progressive pain, swelling, redness of left foot followed by presumed insect bite (patient did not actually witness). PCP send patient to the ED for admission and IV antibiotics.  Assessment/Plan:  Cellulitis &? Abscess of left foot with lymphangitis of LLE - Following presumed insect bite - Failed outpatient antibiotics (IM Rocephin and oral clindamycin) - Started empirically on IV vancomycin - Improving. Continue IV vancomycin for additional 24 hours then consider discharging on oral antibiotics (Bactrim or doxycycline) to outpatient followup. - Wound culture is negative to date.  - S/P by EDP and no real drainage per patient.  Type II DM - Check CBGs - Continue home dose of metformin.  GERD history - Not on PPIs at home.  Anemia history -  Seems stable.  DVT prophylaxis:  Lovenox - patient declines. Recommend early ambulation. Lines/catheters: PIV Nutrition: Diabetic diet  Activity:  Up ad lib. Code Status: Full Family Communication: Discussed with spouse at bedside Disposition Plan: Possible discharge 10/16   Consultants:  None  Procedures:  I&D in ED   Antibiotics:  IV vancomycin 10/15 >   Subjective: Improving pain, swelling and redness of left foot. Able to weight bear. Rates pain as 2-4/10.  Objective: Filed Vitals:   10/29/12 0045 10/29/12 0204 10/29/12 0452 10/29/12 0943  BP: 90/57 107/72 105/58 106/68  Pulse: 92 78 96 81  Temp:  98.4 F (36.9 C) 98.8 F (37.1 C) 97.9 F (36.6 C)  TempSrc:  Oral Oral Oral  Resp:  18 18 20   Height:      Weight:  59.467 kg (131 lb 1.6 oz)    SpO2: 98% 98% 99% 99%    Intake/Output Summary (Last 24 hours) at  10/29/12 1042 Last data filed at 10/29/12 0900  Gross per 24 hour  Intake    240 ml  Output      0 ml  Net    240 ml   Filed Weights   10/28/12 2151 10/29/12 0204  Weight: 58.968 kg (130 lb) 59.467 kg (131 lb 1.6 oz)     Exam:  General exam: Young moderately built and nourished female lying comfortably supine in bed. Respiratory system: Clear. No increased work of breathing. Cardiovascular system: S1 & S2 heard, RRR. No JVD, murmurs, gallops, clicks or pedal edema. Gastrointestinal system: Abdomen is nondistended, soft and nontender. Normal bowel sounds heard. Central nervous system: Alert and oriented. No focal neurological deficits. Extremities: Symmetric 5 x 5 power. LLE: Scabbed/dried blood at I&D site on the mid dorsal left foot. Diffuse erythema, mild warmth but no tenderness over dorsum of left foot and medial left foot and ankle which is receding from pen marking drawn on admission. Decreasing erythematous streaks along medial aspect of left leg/thigh.   Data Reviewed: Basic Metabolic Panel:  Recent Labs Lab 10/28/12 2155 10/29/12 0629  NA 138 137  K 4.2 3.8  CL 103 104  CO2 23 21  GLUCOSE 118* 122*  BUN 12 12  CREATININE 0.60 0.56  CALCIUM 9.5 9.0   Liver Function Tests:  Recent Labs Lab 10/28/12 2155  AST 14  ALT 10  ALKPHOS 63  BILITOT 0.2*  PROT 8.3  ALBUMIN 4.0   No results found for this basename: LIPASE, AMYLASE,  in the  last 168 hours No results found for this basename: AMMONIA,  in the last 168 hours CBC:  Recent Labs Lab 10/28/12 2155 10/29/12 0629  WBC 9.5 9.0  NEUTROABS 5.3  --   HGB 12.5 11.6*  HCT 36.6 33.9*  MCV 86.1 85.8  PLT 330 315   Cardiac Enzymes: No results found for this basename: CKTOTAL, CKMB, CKMBINDEX, TROPONINI,  in the last 168 hours BNP (last 3 results) No results found for this basename: PROBNP,  in the last 8760 hours CBG: No results found for this basename: GLUCAP,  in the last 168 hours  Recent Results  (from the past 240 hour(s))  WOUND CULTURE     Status: None   Collection Time    10/29/12 12:43 AM      Result Value Range Status   Specimen Description WOUND FOOT   Final   Special Requests Normal   Final   Gram Stain     Final   Value: NO WBC SEEN     NO SQUAMOUS EPITHELIAL CELLS SEEN     NO ORGANISMS SEEN     Performed at [facility]   Culture     Final   Value: NO GROWTH     Performed at [facility]   Report Status PENDING   Incomplete      Additional labs: 1. Quantitative beta hCG: <1     Studies: Dg Foot Complete Left  10/28/2012   CLINICAL DATA:  Infected insect bite.  EXAM: LEFT FOOT - COMPLETE 3+ VIEW  COMPARISON:  None.  FINDINGS: Soft tissue swelling medially. No osseous abnormality.  IMPRESSION: No signs of osteomyelitis or foreign body. Soft tissue swelling medially.   Electronically Signed   By: Davonna Belling M.D.   On: 10/28/2012 22:41        Scheduled Meds: . folic acid  400 mcg Oral Daily  . metFORMIN  1,000 mg Oral Q supper  . vancomycin  750 mg Intravenous Q12H   Continuous Infusions:   Principal Problem:   Cellulitis and abscess of foot excluding toe     Time spent: 25 minutes    Mylan Lengyel, MD, FACP, FHM. [facility] Pager [phone]  If 7PM-7AM, please contact night-coverage [email or web address] Password [email or web address] 10/29/2012, 10:42 AM    LOS: 1 day

## 2012-10-30 DIAGNOSIS — D649 Anemia, unspecified: Secondary | ICD-10-CM

## 2012-10-30 DIAGNOSIS — Z8719 Personal history of other diseases of the digestive system: Secondary | ICD-10-CM

## 2012-10-30 DIAGNOSIS — E119 Type 2 diabetes mellitus without complications: Secondary | ICD-10-CM

## 2012-10-30 LAB — CBC
HCT: 33.4 % — ABNORMAL LOW (ref 36.0–46.0)
Hemoglobin: 11.2 g/dL — ABNORMAL LOW (ref 12.0–15.0)
RBC: 3.85 MIL/uL — ABNORMAL LOW (ref 3.87–5.11)
WBC: 7.8 10*3/uL (ref 4.0–10.5)

## 2012-10-30 LAB — GLUCOSE, CAPILLARY
Glucose-Capillary: 126 mg/dL — ABNORMAL HIGH (ref 70–99)
Glucose-Capillary: 111 mg/dL — ABNORMAL HIGH (ref 70–99)

## 2012-10-30 MED ORDER — IBUPROFEN 200 MG PO TABS: 400.0000 mg | ORAL_TABLET | Freq: Three times a day (TID) | ORAL | Status: DC | PRN

## 2012-10-30 MED ORDER — DOXYCYCLINE HYCLATE 100 MG PO CAPS: 100.0000 mg | ORAL_CAPSULE | Freq: Two times a day (BID) | ORAL | Status: DC

## 2012-10-30 NOTE — Discharge Summary (Signed)
Physician Discharge Summary  Kelly Mullen JYN:[id number] DOB: 1975-08-06 DOA: 10/28/2012  PCP: Vivien Presto, MD  Admit date: 10/28/2012 Discharge date: 10/30/2012  Time spent: Less than 30 minutes  Recommendations for Outpatient Follow-up:  1. Dr. Delano Metz, PCP in 5 days 2. Followup final cultures that were drawn in the hospital from the left foot wound on 10/29/12  Discharge Diagnoses:  Principal Problem:   Cellulitis and abscess of foot excluding toe Active Problems:   DM w/o complication type II   H/O gastroesophageal reflux (GERD)   Anemia   Discharge Condition: Improved & Stable  Diet recommendation: Heart healthy and diabetic diet  Filed Weights   10/28/12 2151 10/29/12 0204 10/29/12 2021  Weight: 58.968 kg (130 lb) 59.467 kg (131 lb 1.6 oz) 59.467 kg (131 lb 1.6 oz)    History of present illness:  37 year old female patient with history of type II DM, anemia and GERD was admitted on 10/29/12 with 3 days history of progressive pain, swelling, redness of left foot followed by presumed insect bite (patient did not actually witness). PCP send patient to the ED for admission and IV antibiotics.  Hospital Course:   Staphylococcal Cellulitis &? Abscess of left foot with lymphangitis of LLE  - Following presumed insect bite  - Failed outpatient antibiotics (IM Rocephin and oral clindamycin)  - Started empirically on IV vancomycin & has completed 3 doses. - Much improved. Lymphangitis has clinically resolved. - Final wound cultures pending  - S/P I&D by EDP and no real drainage per patient.  - Will discharge home on oral doxycycline to complete 7 days course.  Type II DM  - Reasonable inpatient control - Continue home dose of metformin.   GERD history  - Not on PPIs at home.   Anemia history  - Seems stable   Consultants:  None  Procedures:  I&D in ED    Discharge Exam:  Complaints: Much improved pain, swelling and redness of left foot. Able to  ambulate without difficulty.  Filed Vitals:   10/29/12 1832 10/29/12 2021 10/30/12 0502 10/30/12 0936  BP: 105/65 112/64 114/51 110/76  Pulse: 80 82 76 79  Temp: 98.6 F (37 C) 98.4 F (36.9 C) 98.4 F (36.9 C) 98.2 F (36.8 C)  TempSrc: Oral Oral Oral Oral  Resp: 20 20 20 20   Height:      Weight:  59.467 kg (131 lb 1.6 oz)    SpO2: 97% 96% 100% 96%    General exam: Young moderately built and nourished female lying comfortably supine in bed.  Respiratory system: Clear. No increased work of breathing.  Cardiovascular system: S1 & S2 heard, RRR. No JVD, murmurs, gallops, clicks or pedal edema.  Gastrointestinal system: Abdomen is nondistended, soft and nontender. Normal bowel sounds heard.  Central nervous system: Alert and oriented. No focal neurological deficits.  Extremities: Symmetric 5 x 5 power. LLE: Scabbed/dried blood at I&D site on the mid dorsal left foot. Diffuse erythema, mild warmth but no tenderness over dorsum of left foot and medial left foot and ankle which is receding from pen marking drawn on admission. Decreasing erythematous streaks along medial aspect of left leg/thigh. All findings significantly improved compared to 10/29/12.   Discharge Instructions      Discharge Orders   Future Orders Complete By Expires   Call MD for:  redness, tenderness, or signs of infection (pain, swelling, redness, odor or green/yellow discharge around incision site)  As directed    Call MD for:  severe uncontrolled  pain  As directed    Call MD for:  temperature >100.4  As directed    Diet - low sodium heart healthy  As directed    Diet Carb Modified  As directed    Increase activity slowly  As directed        Medication List    STOP taking these medications       clindamycin 300 MG capsule  Commonly known as:  CLEOCIN     ROCEPHIN IJ      TAKE these medications       doxycycline 100 MG capsule  Commonly known as:  VIBRAMYCIN  Take 1 capsule (100 mg total) by mouth  2 (two) times daily.     folic acid 400 MCG tablet  Commonly known as:  FOLVITE  Take 400 mcg by mouth daily.     guaiFENesin 100 MG/5ML liquid  Commonly known as:  ROBITUSSIN  Take 200 mg by mouth 3 (three) times daily as needed for cough.     ibuprofen 200 MG tablet  Commonly known as:  ADVIL,MOTRIN  Take 2 tablets (400 mg total) by mouth every 8 (eight) hours as needed for pain. Use for 2-3 days.     metFORMIN 1000 MG tablet  Commonly known as:  GLUCOPHAGE  Take 1,000 mg by mouth daily with supper.       Follow-up Information   Follow up with CORRINGTON,KIP A, MD. Schedule an appointment as soon as possible for a visit in 5 days.   Specialty:  Family Medicine   Contact information:   [phone] [place] [address] [phone]        The results of significant diagnostics from this hospitalization (including imaging, microbiology, ancillary and laboratory) are listed below for reference.    Significant Diagnostic Studies: Dg Foot Complete Left  10/28/2012   CLINICAL DATA:  Infected insect bite.  EXAM: LEFT FOOT - COMPLETE 3+ VIEW  COMPARISON:  None.  FINDINGS: Soft tissue swelling medially. No osseous abnormality.  IMPRESSION: No signs of osteomyelitis or foreign body. Soft tissue swelling medially.   Electronically Signed   By: Davonna Belling M.D.   On: 10/28/2012 22:41    Microbiology: Recent Results (from the past 240 hour(s))  WOUND CULTURE     Status: None   Collection Time    10/29/12 12:43 AM      Result Value Range Status   Specimen Description WOUND FOOT   Final   Special Requests Normal   Final   Gram Stain     Final   Value: NO WBC SEEN     NO SQUAMOUS EPITHELIAL CELLS SEEN     NO ORGANISMS SEEN     Performed at [facility]   Culture     Final   Value: FEW STAPHYLOCOCCUS AUREUS     Note: RIFAMPIN AND GENTAMICIN SHOULD NOT BE USED AS SINGLE DRUGS FOR TREATMENT OF STAPH INFECTIONS.     Performed at [facility]    Report Status PENDING   Incomplete     Labs: Basic Metabolic Panel:  Recent Labs Lab 10/28/12 2155 10/29/12 0629  NA 138 137  K 4.2 3.8  CL 103 104  CO2 23 21  GLUCOSE 118* 122*  BUN 12 12  CREATININE 0.60 0.56  CALCIUM 9.5 9.0   Liver Function Tests:  Recent Labs Lab 10/28/12 2155  AST 14  ALT 10  ALKPHOS 63  BILITOT 0.2*  PROT  8.3  ALBUMIN 4.0   No results found for this basename: LIPASE, AMYLASE,  in the last 168 hours No results found for this basename: AMMONIA,  in the last 168 hours CBC:  Recent Labs Lab 10/28/12 2155 10/29/12 0629 10/30/12 0400  WBC 9.5 9.0 7.8  NEUTROABS 5.3  --   --   HGB 12.5 11.6* 11.2*  HCT 36.6 33.9* 33.4*  MCV 86.1 85.8 86.8  PLT 330 315 315   Cardiac Enzymes: No results found for this basename: CKTOTAL, CKMB, CKMBINDEX, TROPONINI,  in the last 168 hours BNP: BNP (last 3 results) No results found for this basename: PROBNP,  in the last 8760 hours CBG:  Recent Labs Lab 10/29/12 1143 10/29/12 1648 10/29/12 2024 10/30/12 0742 10/30/12 1218  GLUCAP 126* 156* 125* 126* 111*    Additional labs:  1. Quantitative beta hCG: <1    Signed:  Marcellus Scott, MD, FACP, FHM. [facility] Pager [phone]  If 7PM-7AM, please contact night-coverage [email or web address] Password [email or web address] 10/30/2012, 2:24 PM

## 2012-10-31 LAB — WOUND CULTURE: Special Requests: NORMAL

## 2013-11-16 ENCOUNTER — Encounter (HOSPITAL_COMMUNITY): Payer: Self-pay | Admitting: *Deleted

## 2014-01-15 NOTE — L&D Delivery Note (Addendum)
Delivery Note At 8:50 AM a viable and healthy female was delivered via  (Presentation: OA).  APGAR: 8, 9; weight -pending .   Placenta status: Intact, Spontaneous.  Cord: 3-vessel with nuchal cordx2, released at head delivery..  Cord pH: N/A  Anesthesia:  Epidural Episiotomy:  None Lacerations:  None Est. Blood Loss (mL):  150 cc  Mom to postpartum.  Baby to Couplet care / Skin to Skin.  Desires PPTL, counseled and agrees. Keep NPO, proceed after 1 hr of PP recovery.   Brainard Highfill R 09/11/2014, 9:25 AM

## 2014-02-25 LAB — OB RESULTS CONSOLE ABO/RH: RH Type: POSITIVE

## 2014-02-25 LAB — OB RESULTS CONSOLE RUBELLA ANTIBODY, IGM: Rubella: IMMUNE

## 2014-02-25 LAB — OB RESULTS CONSOLE RPR: RPR: NONREACTIVE

## 2014-02-25 LAB — OB RESULTS CONSOLE ANTIBODY SCREEN: ANTIBODY SCREEN: NEGATIVE

## 2014-02-25 LAB — OB RESULTS CONSOLE HIV ANTIBODY (ROUTINE TESTING): HIV: NONREACTIVE

## 2014-02-25 LAB — OB RESULTS CONSOLE HEPATITIS B SURFACE ANTIGEN: HEP B S AG: NEGATIVE

## 2014-03-05 LAB — OB RESULTS CONSOLE GC/CHLAMYDIA
CHLAMYDIA, DNA PROBE: NEGATIVE
Gonorrhea: NEGATIVE

## 2014-08-27 LAB — OB RESULTS CONSOLE GBS: STREP GROUP B AG: NEGATIVE

## 2014-09-10 ENCOUNTER — Inpatient Hospital Stay (HOSPITAL_COMMUNITY)
Admission: AD | Admit: 2014-09-10 | Discharge: 2014-09-12 | DRG: 767 | Disposition: A | Discharge status: Home / Self Care | Payer: BLUE CROSS/BLUE SHIELD | Source: Ambulatory Visit | Attending: Obstetrics & Gynecology | Admitting: Obstetrics & Gynecology

## 2014-09-10 ENCOUNTER — Encounter (HOSPITAL_COMMUNITY): Payer: Self-pay

## 2014-09-10 DIAGNOSIS — Z3A37 37 weeks gestation of pregnancy: Secondary | ICD-10-CM | POA: Diagnosis present

## 2014-09-10 DIAGNOSIS — O9832 Other infections with a predominantly sexual mode of transmission complicating childbirth: Secondary | ICD-10-CM | POA: Diagnosis present

## 2014-09-10 DIAGNOSIS — Z302 Encounter for sterilization: Secondary | ICD-10-CM

## 2014-09-10 DIAGNOSIS — D62 Acute posthemorrhagic anemia: Secondary | ICD-10-CM | POA: Diagnosis not present

## 2014-09-10 DIAGNOSIS — O09523 Supervision of elderly multigravida, third trimester: Secondary | ICD-10-CM

## 2014-09-10 DIAGNOSIS — K219 Gastro-esophageal reflux disease without esophagitis: Secondary | ICD-10-CM | POA: Diagnosis present

## 2014-09-10 DIAGNOSIS — Z9851 Tubal ligation status: Secondary | ICD-10-CM

## 2014-09-10 DIAGNOSIS — O9962 Diseases of the digestive system complicating childbirth: Secondary | ICD-10-CM | POA: Diagnosis present

## 2014-09-10 DIAGNOSIS — O24429 Gestational diabetes mellitus in childbirth, unspecified control: Principal | ICD-10-CM | POA: Diagnosis present

## 2014-09-10 DIAGNOSIS — O9902 Anemia complicating childbirth: Secondary | ICD-10-CM | POA: Diagnosis present

## 2014-09-10 HISTORY — DX: Acute posthemorrhagic anemia: D62

## 2014-09-10 HISTORY — DX: Tubal ligation status: Z98.51

## 2014-09-10 MED ORDER — LIDOCAINE HCL (PF) 1 % IJ SOLN
30.0000 mL | INTRAMUSCULAR | Status: DC | PRN
  Filled 2014-09-10: qty 30

## 2014-09-10 MED ORDER — CITRIC ACID-SODIUM CITRATE 334-500 MG/5ML PO SOLN
30.0000 mL | ORAL | Status: DC | PRN
  Administered 2014-09-11: 30 mL via ORAL
  Filled 2014-09-10: qty 15

## 2014-09-10 MED ORDER — OXYTOCIN BOLUS FROM INFUSION
500.0000 mL | INTRAVENOUS | Status: DC
  Administered 2014-09-11: 500 mL via INTRAVENOUS

## 2014-09-10 MED ORDER — OXYCODONE-ACETAMINOPHEN 5-325 MG PO TABS: 1.0000 | ORAL_TABLET | ORAL | Status: DC | PRN

## 2014-09-10 MED ORDER — LACTATED RINGERS IV SOLN
500.0000 mL | INTRAVENOUS | Status: DC | PRN
  Administered 2014-09-11: 500 mL via INTRAVENOUS

## 2014-09-10 MED ORDER — ONDANSETRON HCL 4 MG/2ML IJ SOLN: 4.0000 mg | Freq: Four times a day (QID) | INTRAMUSCULAR | Status: DC | PRN

## 2014-09-10 MED ORDER — OXYCODONE-ACETAMINOPHEN 5-325 MG PO TABS
2.0000 | ORAL_TABLET | ORAL | Status: DC | PRN
Start: 2014-09-10 — End: 2014-09-11

## 2014-09-10 MED ORDER — ACETAMINOPHEN 325 MG PO TABS: 650.0000 mg | ORAL_TABLET | ORAL | Status: DC | PRN

## 2014-09-10 MED ORDER — OXYTOCIN 40 UNITS IN LACTATED RINGERS INFUSION - SIMPLE MED
62.5000 mL/h | INTRAVENOUS | Status: DC
Start: 2014-09-10 — End: 2014-09-11
  Filled 2014-09-10: qty 1000

## 2014-09-10 MED ORDER — LACTATED RINGERS IV SOLN
INTRAVENOUS | Status: DC
  Administered 2014-09-10 – 2014-09-11 (×3): via INTRAVENOUS

## 2014-09-10 MED ORDER — FLEET ENEMA 7-19 GM/118ML RE ENEM: 1.0000 | ENEMA | Freq: Every day | RECTAL | Status: DC | PRN

## 2014-09-10 NOTE — MAU Note (Addendum)
Was 1 cm at office today.  Having contractions 7 min apart.  No leaking . No bleeding.  Baby moving well.  Reports fast labors.

## 2014-09-10 NOTE — H&P (Signed)
Kelly Mullen is a 39 y.o. female presenting at 58 wks in labor. No leaking/ bleeding. Good FMs. Cervix changed from 1 cm in office to 3 cm per MAU RN with UCs getting stronger.  H/o rapid labor with 2nd baby, didn't get epidural, had GDM (A2), shoulder dystocia but no residual effects.   She is G3P2002, with pre-pregnancy DM (class B), on Insulin and Metformin, sees endocrinologist Dr Chalmers Cater.  AMA. PNCare Dr Benjie Karvonen primary. Antesting with wkly NST/BPP 10/10. Growth sono q 4 wks from 28wks. Last sono at 36 wks, 6'10" at 80% with AC at 95%.  Prior both pregnancies with GDM and 2nd with shoulder dystocia. HSV hx, no outbreaks.    History OB History    Gravida Para Term Preterm AB TAB SAB Ectopic Multiple Living   3 2 2       2      Past Medical History  Diagnosis Date  . Diabetes mellitus   . Gestational diabetes     glyburide  . AMA (advanced maternal age) multigravida 39+   . Anemia   . Genital herpes, unspecified   . Abnormal Pap smear   . GERD (gastroesophageal reflux disease)   . Postpartum care following vaginal delivery (7/4) 07/19/2011  . GDM, class A2 (delivered) 07/19/2011  . Maternal anemia complicating pregnancy, childbirth, or the puerperium 07/19/2011   Past Surgical History  Procedure Laterality Date  . Leg surgery      faciatomy bilateral legs   Family History: family history includes Diabetes in her brother, paternal aunt, and paternal grandmother. Social History:  reports that she has never smoked. She has never used smokeless tobacco. She reports that she does not drink alcohol or use illicit drugs.   Prenatal Transfer Tool  Maternal Diabetes: Yes:  Diabetes Type:  Pre-pregnancy, Insulin/Medication controlled Genetic Screening: Normal Maternal Ultrasounds/Referrals: Normal Fetal Ultrasounds or other Referrals:  Fetal echo normal  Maternal Substance Abuse:  No Significant Maternal Medications:  Meds include: Other:  Insulin, Metformin Significant Maternal Lab Results:   Lab values include: Group B Strep negative Other Comments:  None  ROS neg   Dilation: 3 Effacement (%): 50 Exam by:: K Henson, RNC Blood pressure 121/80, pulse 100, temperature 98.3 F (36.8 C), temperature source Oral, resp. rate 16, height 5' 2"  (1.575 m), weight 148 lb (67.132 kg), SpO2 97 %. Exam   Physical Exam  BP 121/80 mmHg  Pulse 100  Temp(Src) 98.3 F (36.8 C) (Oral)  Resp 16  Ht 5' 2"  (1.575 m)  Wt 148 lb (67.132 kg)  BMI 27.06 kg/m2  SpO2 97% Physical exam:  A&O x 3, no acute distress. Pleasant HEENT neg, no thyromegaly Lungs CTA bilat CV RRR, S1S2 normal Abdo soft, non tender, non acute Extr no edema/ tenderness Pelvic Per MAU RN-Dilation: 3/ effacement (%): 50 /Exam by:: Pollyann Samples, RNC FHT 125/ + accels/ no decels/ mod variab- category I Toco spontaneous q 3 min  Prenatal labs: ABO, Rh: AB/Positive/-- (02/11 0000) Antibody: Negative (02/11 0000) Rubella: Immune (02/11 0000) RPR: Nonreactive (02/11 0000)  HBsAg: Negative (02/11 0000)  HIV: Non-reactive (02/11 0000)  GBS: Negative (08/12 0000)  NT/ ultrascreen and AFP1 neg Anatomy sono and fetal echo normal   Assessment/Plan: 39 yo, G3P2, 37 wks, labor, class B DM, on Insulin. Last pregnancy with shoulder dystocia at 38 wks (8'5"). Now 37 wks, EFW 7-7.1/2 lbs, well controlled DM with Insulin/Metformin. Reviewed risk of recurrence and option of C/section in office, after consideration, pt wants to try  vaginal birth, recommend epidural, reviewed shoulder dystocia maneuvers. GBS neg.     Vedant Shehadeh R 09/10/2014, 11:36 PM

## 2014-09-11 ENCOUNTER — Inpatient Hospital Stay (HOSPITAL_COMMUNITY): Payer: BLUE CROSS/BLUE SHIELD | Admitting: Anesthesiology

## 2014-09-11 ENCOUNTER — Encounter (HOSPITAL_COMMUNITY): Payer: Self-pay | Admitting: Certified Registered Nurse Anesthetist

## 2014-09-11 ENCOUNTER — Encounter (HOSPITAL_COMMUNITY)
Admission: AD | Disposition: A | Discharge status: Home / Self Care | Payer: Self-pay | Source: Ambulatory Visit | Attending: Obstetrics & Gynecology

## 2014-09-11 DIAGNOSIS — Z9851 Tubal ligation status: Secondary | ICD-10-CM

## 2014-09-11 HISTORY — PX: TUBAL LIGATION: SHX77

## 2014-09-11 HISTORY — DX: Tubal ligation status: Z98.51

## 2014-09-11 LAB — CBC
HEMATOCRIT: 32.6 % — AB (ref 36.0–46.0)
Hemoglobin: 11.1 g/dL — ABNORMAL LOW (ref 12.0–15.0)
MCH: 28.6 pg (ref 26.0–34.0)
MCHC: 34 g/dL (ref 30.0–36.0)
MCV: 84 fL (ref 78.0–100.0)
PLATELETS: 265 10*3/uL (ref 150–400)
RBC: 3.88 MIL/uL (ref 3.87–5.11)
RDW: 14.4 % (ref 11.5–15.5)
WBC: 11.5 10*3/uL — ABNORMAL HIGH (ref 4.0–10.5)

## 2014-09-11 LAB — GLUCOSE, CAPILLARY
GLUCOSE-CAPILLARY: 92 mg/dL (ref 65–99)
GLUCOSE-CAPILLARY: 118 mg/dL — AB (ref 65–99)
Glucose-Capillary: 107 mg/dL — ABNORMAL HIGH (ref 65–99)
Glucose-Capillary: 73 mg/dL (ref 65–99)
Glucose-Capillary: 89 mg/dL (ref 65–99)

## 2014-09-11 LAB — RPR: RPR: NONREACTIVE

## 2014-09-11 SURGERY — LIGATION, FALLOPIAN TUBE, POSTPARTUM
Anesthesia: Epidural | Laterality: Bilateral

## 2014-09-11 MED ORDER — PROMETHAZINE HCL 25 MG/ML IJ SOLN: 6.2500 mg | INTRAMUSCULAR | Status: DC | PRN

## 2014-09-11 MED ORDER — MIDAZOLAM HCL 2 MG/2ML IJ SOLN
INTRAMUSCULAR | Status: AC
  Filled 2014-09-11: qty 4

## 2014-09-11 MED ORDER — CEFAZOLIN SODIUM-DEXTROSE 2-3 GM-% IV SOLR
2.0000 g | INTRAVENOUS | Status: DC
Start: 2014-09-11 — End: 2014-09-11
  Filled 2014-09-11: qty 50

## 2014-09-11 MED ORDER — PROPOFOL 10 MG/ML IV BOLUS
INTRAVENOUS | Status: AC
  Filled 2014-09-11: qty 20

## 2014-09-11 MED ORDER — FENTANYL CITRATE (PF) 100 MCG/2ML IJ SOLN: 25.0000 ug | INTRAMUSCULAR | Status: DC | PRN

## 2014-09-11 MED ORDER — PRENATAL MULTIVITAMIN CH
1.0000 | ORAL_TABLET | Freq: Every day | ORAL | Status: DC
  Filled 2014-09-11: qty 1

## 2014-09-11 MED ORDER — LIDOCAINE-EPINEPHRINE (PF) 2 %-1:200000 IJ SOLN
INTRAMUSCULAR | Status: AC
  Filled 2014-09-11: qty 20

## 2014-09-11 MED ORDER — ACETAMINOPHEN 325 MG PO TABS: 650.0000 mg | ORAL_TABLET | ORAL | Status: DC | PRN

## 2014-09-11 MED ORDER — ONDANSETRON HCL 4 MG PO TABS: 4.0000 mg | ORAL_TABLET | ORAL | Status: DC | PRN

## 2014-09-11 MED ORDER — METFORMIN HCL ER 500 MG PO TB24
1000.0000 mg | ORAL_TABLET | Freq: Every day | ORAL | Status: DC
  Administered 2014-09-11: 1000 mg via ORAL
  Filled 2014-09-11 (×2): qty 2

## 2014-09-11 MED ORDER — SODIUM BICARBONATE 8.4 % IV SOLN
INTRAVENOUS | Status: DC | PRN
  Administered 2014-09-11 (×3): 5 mL via EPIDURAL

## 2014-09-11 MED ORDER — SENNOSIDES-DOCUSATE SODIUM 8.6-50 MG PO TABS
2.0000 | ORAL_TABLET | ORAL | Status: DC
  Administered 2014-09-11: 2 via ORAL
  Filled 2014-09-11: qty 2

## 2014-09-11 MED ORDER — DEXAMETHASONE SODIUM PHOSPHATE 4 MG/ML IJ SOLN
INTRAMUSCULAR | Status: AC
  Filled 2014-09-11: qty 1

## 2014-09-11 MED ORDER — ZOLPIDEM TARTRATE 5 MG PO TABS: 5.0000 mg | ORAL_TABLET | Freq: Every evening | ORAL | Status: DC | PRN

## 2014-09-11 MED ORDER — BENZOCAINE-MENTHOL 20-0.5 % EX AERO
1.0000 "application " | INHALATION_SPRAY | CUTANEOUS | Status: DC | PRN
  Filled 2014-09-11: qty 56

## 2014-09-11 MED ORDER — PHENYLEPHRINE 40 MCG/ML (10ML) SYRINGE FOR IV PUSH (FOR BLOOD PRESSURE SUPPORT)
80.0000 ug | PREFILLED_SYRINGE | INTRAVENOUS | Status: DC | PRN
  Filled 2014-09-11: qty 20

## 2014-09-11 MED ORDER — LIDOCAINE HCL (CARDIAC) 20 MG/ML IV SOLN
INTRAVENOUS | Status: AC
  Filled 2014-09-11: qty 5

## 2014-09-11 MED ORDER — METFORMIN HCL 500 MG PO TABS
1000.0000 mg | ORAL_TABLET | Freq: Every day | ORAL | Status: DC
  Filled 2014-09-11: qty 2

## 2014-09-11 MED ORDER — SCOPOLAMINE 1 MG/3DAYS TD PT72
MEDICATED_PATCH | TRANSDERMAL | Status: AC
  Filled 2014-09-11: qty 1

## 2014-09-11 MED ORDER — IBUPROFEN 600 MG PO TABS
600.0000 mg | ORAL_TABLET | Freq: Four times a day (QID) | ORAL | Status: DC
  Administered 2014-09-11 – 2014-09-12 (×4): 600 mg via ORAL
  Filled 2014-09-11 (×4): qty 1

## 2014-09-11 MED ORDER — LANOLIN HYDROUS EX OINT: TOPICAL_OINTMENT | CUTANEOUS | Status: DC | PRN

## 2014-09-11 MED ORDER — BUPIVACAINE HCL (PF) 0.25 % IJ SOLN
INTRAMUSCULAR | Status: AC
  Filled 2014-09-11: qty 30

## 2014-09-11 MED ORDER — OXYCODONE-ACETAMINOPHEN 5-325 MG PO TABS: 2.0000 | ORAL_TABLET | ORAL | Status: DC | PRN

## 2014-09-11 MED ORDER — OXYCODONE-ACETAMINOPHEN 5-325 MG PO TABS
1.0000 | ORAL_TABLET | ORAL | Status: DC | PRN
Start: 2014-09-11 — End: 2014-09-12

## 2014-09-11 MED ORDER — EPHEDRINE 5 MG/ML INJ: 10.0000 mg | INTRAVENOUS | Status: DC | PRN

## 2014-09-11 MED ORDER — TETANUS-DIPHTH-ACELL PERTUSSIS 5-2.5-18.5 LF-MCG/0.5 IM SUSP: 0.5000 mL | Freq: Once | INTRAMUSCULAR | Status: DC

## 2014-09-11 MED ORDER — ONDANSETRON HCL 4 MG/2ML IJ SOLN: 4.0000 mg | INTRAMUSCULAR | Status: DC | PRN

## 2014-09-11 MED ORDER — SODIUM BICARBONATE 8.4 % IV SOLN
INTRAVENOUS | Status: AC
  Filled 2014-09-11: qty 50

## 2014-09-11 MED ORDER — ONDANSETRON HCL 4 MG/2ML IJ SOLN
INTRAMUSCULAR | Status: DC | PRN
  Administered 2014-09-11: 4 mg via INTRAVENOUS

## 2014-09-11 MED ORDER — FENTANYL CITRATE (PF) 100 MCG/2ML IJ SOLN
INTRAMUSCULAR | Status: AC
  Filled 2014-09-11: qty 4

## 2014-09-11 MED ORDER — LIDOCAINE HCL (PF) 1 % IJ SOLN
INTRAMUSCULAR | Status: DC | PRN
  Administered 2014-09-11 (×2): 4 mL

## 2014-09-11 MED ORDER — DIPHENHYDRAMINE HCL 25 MG PO CAPS: 25.0000 mg | ORAL_CAPSULE | Freq: Four times a day (QID) | ORAL | Status: DC | PRN

## 2014-09-11 MED ORDER — SIMETHICONE 80 MG PO CHEW: 80.0000 mg | CHEWABLE_TABLET | ORAL | Status: DC | PRN

## 2014-09-11 MED ORDER — FENTANYL CITRATE (PF) 100 MCG/2ML IJ SOLN
INTRAMUSCULAR | Status: DC | PRN
  Administered 2014-09-11 (×4): 25 ug via INTRAVENOUS

## 2014-09-11 MED ORDER — DIPHENHYDRAMINE HCL 50 MG/ML IJ SOLN: 12.5000 mg | INTRAMUSCULAR | Status: DC | PRN

## 2014-09-11 MED ORDER — BUPIVACAINE HCL (PF) 0.25 % IJ SOLN
INTRAMUSCULAR | Status: DC | PRN
  Administered 2014-09-11: 3 mL

## 2014-09-11 MED ORDER — MIDAZOLAM HCL 2 MG/2ML IJ SOLN
INTRAMUSCULAR | Status: DC | PRN
  Administered 2014-09-11 (×2): 1 mg via INTRAVENOUS

## 2014-09-11 MED ORDER — CEFAZOLIN SODIUM-DEXTROSE 2-3 GM-% IV SOLR
INTRAVENOUS | Status: DC | PRN
  Administered 2014-09-11: 2 g via INTRAVENOUS

## 2014-09-11 MED ORDER — DEXAMETHASONE SODIUM PHOSPHATE 4 MG/ML IJ SOLN
INTRAMUSCULAR | Status: DC | PRN
  Administered 2014-09-11: 4 mg via INTRAVENOUS

## 2014-09-11 MED ORDER — ONDANSETRON HCL 4 MG/2ML IJ SOLN
INTRAMUSCULAR | Status: AC
  Filled 2014-09-11: qty 2

## 2014-09-11 MED ORDER — SCOPOLAMINE 1 MG/3DAYS TD PT72
MEDICATED_PATCH | TRANSDERMAL | Status: DC | PRN
  Administered 2014-09-11: 1 via TRANSDERMAL

## 2014-09-11 MED ORDER — DIBUCAINE 1 % RE OINT: 1.0000 "application " | TOPICAL_OINTMENT | RECTAL | Status: DC | PRN

## 2014-09-11 MED ORDER — WITCH HAZEL-GLYCERIN EX PADS: 1.0000 "application " | MEDICATED_PAD | CUTANEOUS | Status: DC | PRN

## 2014-09-11 MED ORDER — FENTANYL 2.5 MCG/ML BUPIVACAINE 1/10 % EPIDURAL INFUSION (WH - ANES)
14.0000 mL/h | INTRAMUSCULAR | Status: DC | PRN
  Administered 2014-09-11: 12.5 mL/h via EPIDURAL
  Filled 2014-09-11: qty 125

## 2014-09-11 SURGICAL SUPPLY — 25 items
CHLORAPREP W/TINT 26ML (MISCELLANEOUS) ×2 IMPLANT
CLOTH BEACON ORANGE TIMEOUT ST (SAFETY) ×2 IMPLANT
CONTAINER PREFILL 10% NBF 15ML (MISCELLANEOUS) ×4 IMPLANT
DRESSING OPSITE X SMALL 2X3 (GAUZE/BANDAGES/DRESSINGS) ×2 IMPLANT
DRSG OPSITE POSTOP 3X4 (GAUZE/BANDAGES/DRESSINGS) ×2 IMPLANT
ELECT REM PT RETURN 9FT ADLT (ELECTROSURGICAL) ×2
ELECTRODE REM PT RTRN 9FT ADLT (ELECTROSURGICAL) ×1 IMPLANT
GLOVE BIO SURGEON STRL SZ7 (GLOVE) ×2 IMPLANT
GLOVE BIOGEL PI IND STRL 7.0 (GLOVE) ×1 IMPLANT
GLOVE BIOGEL PI INDICATOR 7.0 (GLOVE) ×1
GOWN STRL REUS W/TWL LRG LVL3 (GOWN DISPOSABLE) ×4 IMPLANT
NEEDLE HYPO 22GX1.5 SAFETY (NEEDLE) ×2 IMPLANT
NS IRRIG 1000ML POUR BTL (IV SOLUTION) ×2 IMPLANT
PACK ABDOMINAL MINOR (CUSTOM PROCEDURE TRAY) ×2 IMPLANT
PENCIL BUTTON HOLSTER BLD 10FT (ELECTRODE) ×2 IMPLANT
SPONGE LAP 4X18 X RAY DECT (DISPOSABLE) IMPLANT
STRIP CLOSURE SKIN 1/4X3 (GAUZE/BANDAGES/DRESSINGS) ×2 IMPLANT
SUT PLAIN 0 NONE (SUTURE) ×2 IMPLANT
SUT VIC AB 0 CT1 27 (SUTURE) ×1
SUT VIC AB 0 CT1 27XBRD ANBCTR (SUTURE) ×1 IMPLANT
SUT VICRYL 4-0 PS2 18IN ABS (SUTURE) ×2 IMPLANT
SYR CONTROL 10ML LL (SYRINGE) ×2 IMPLANT
TOWEL OR 17X24 6PK STRL BLUE (TOWEL DISPOSABLE) ×4 IMPLANT
TRAY FOLEY CATH SILVER 14FR (SET/KITS/TRAYS/PACK) ×2 IMPLANT
WATER STERILE IRR 1000ML POUR (IV SOLUTION) ×2 IMPLANT

## 2014-09-11 NOTE — Transfer of Care (Signed)
Immediate Anesthesia Transfer of Care Note  Patient: Kelly Mullen  Procedure(s) Performed: Procedure(s): POST PARTUM TUBAL LIGATION (Bilateral)  Patient Location: PACU  Anesthesia Type:Epidural  Level of Consciousness: awake, alert , oriented and patient cooperative  Airway & Oxygen Therapy: Patient Spontanous Breathing  Post-op Assessment: Report given to RN and Post -op Vital signs reviewed and stable  Post vital signs: Reviewed and stable  Last Vitals:  Filed Vitals:   09/11/14 0955  BP: 126/71  Pulse: 81  Temp:   Resp: 18    Complications: No apparent anesthesia complications

## 2014-09-11 NOTE — Progress Notes (Signed)
Patient ID: Kelly Mullen, female   DOB: [birth year], 39 y.o.   MRN: [id number] Pre-Op Note-  39 yo G3P3, NSVD at 8.50 am today. Class B DM, well controlled with Insulin. No other med/surg hx. Desires permanent sterilization with tubal ligation. Reviewed all options including Essure and LARC and interval sterilization, she wants to proceed now due to convenience of recovery.  Reviewed sterilization procedure, risks/complications and failure risk with risk of ectopic pregnancy from 1:1000 to higher, pt voiced understanding and given informed written consent.   Risks/complications of surgery reviewed incl infection, bleeding, damage to internal organs including bladder, bowels, ureters, blood vessels, other risks from anesthesia, VTE and delayed complications of any surgery, complications in future surgery reviewed.   V.Alexarae Oliva, MD

## 2014-09-11 NOTE — Anesthesia Procedure Notes (Signed)
Epidural Patient location during procedure: OB  Staffing Anesthesiologist: Jahson Emanuele Performed by: anesthesiologist   Preanesthetic Checklist Completed: patient identified, site marked, surgical consent, pre-op evaluation, timeout performed, IV checked, risks and benefits discussed and monitors and equipment checked  Epidural Patient position: sitting Prep: site prepped and draped and DuraPrep Patient monitoring: continuous pulse ox and blood pressure Approach: midline Location: L3-L4 Injection technique: LOR saline  Needle:  Needle type: Tuohy  Needle gauge: 17 G Needle length: 9 cm and 9 Needle insertion depth: 4 cm Catheter type: closed end flexible Catheter size: 19 Gauge Catheter at skin depth: 9 cm Test dose: negative  Assessment Events: blood not aspirated, injection not painful, no injection resistance, negative IV test and no paresthesia  Additional Notes Patient identified. Risks/Benefits/Options discussed with patient including but not limited to bleeding, infection, nerve damage, paralysis, failed block, incomplete pain control, headache, blood pressure changes, nausea, vomiting, reactions to medication both or allergic, itching and postpartum back pain. Confirmed with bedside nurse the patient's most recent platelet count. Confirmed with patient that they are not currently taking any anticoagulation, have any bleeding history or any family history of bleeding disorders. Patient expressed understanding and wished to proceed. All questions were answered. Sterile technique was used throughout the entire procedure. Please see nursing notes for vital signs. Test dose was given through epidural catheter and negative prior to continuing to dose epidural or start infusion. Warning signs of high block given to the patient including shortness of breath, tingling/numbness in hands, complete motor block, or any concerning symptoms with instructions to call for help. Patient was  given instructions on fall risk and not to get out of bed. All questions and concerns addressed with instructions to call with any issues or inadequate analgesia.

## 2014-09-11 NOTE — Anesthesia Postprocedure Evaluation (Signed)
  Anesthesia Post-op Note  Patient: Kelly Mullen  Procedure(s) Performed: Procedure(s) (LRB): POST PARTUM TUBAL LIGATION (Bilateral)  Patient Location: PACU  Anesthesia Type: Epidural  Level of Consciousness: awake and alert   Airway and Oxygen Therapy: Patient Spontanous Breathing  Post-op Pain: mild  Post-op Assessment: Post-op Vital signs reviewed, Patient's Cardiovascular Status Stable, Respiratory Function Stable, Patent Airway and No signs of Nausea or vomiting  Last Vitals:  Filed Vitals:   09/11/14 1115  BP: 106/62  Pulse: 76  Temp:   Resp: 14    Post-op Vital Signs: stable   Complications: No apparent anesthesia complications

## 2014-09-11 NOTE — Anesthesia Preprocedure Evaluation (Signed)
Anesthesia Evaluation  Patient identified by MRN, date of birth, ID band Patient awake    Reviewed: Allergy & Precautions, NPO status , Patient's Chart, lab work & pertinent test results  History of Anesthesia Complications Negative for: history of anesthetic complications  Airway Mallampati: II  TM Distance: >3 FB Neck ROM: Full    Dental no notable dental hx. (+) Dental Advisory Given   Pulmonary neg pulmonary ROS,  breath sounds clear to auscultation  Pulmonary exam normal       Cardiovascular negative cardio ROS Normal cardiovascular examRhythm:Regular Rate:Normal     Neuro/Psych negative neurological ROS  negative psych ROS   GI/Hepatic negative GI ROS, Neg liver ROS,   Endo/Other  diabetes, Oral Hypoglycemic Agents  Renal/GU negative Renal ROS  negative genitourinary   Musculoskeletal negative musculoskeletal ROS (+)   Abdominal   Peds negative pediatric ROS (+)  Hematology  (+) anemia ,   Anesthesia Other Findings   Reproductive/Obstetrics (+) Pregnancy                             Anesthesia Physical  Anesthesia Plan  ASA: II  Anesthesia Plan: Epidural   Post-op Pain Management:    Induction:   Airway Management Planned:   Additional Equipment:   Intra-op Plan:   Post-operative Plan:   Informed Consent: I have reviewed the patients History and Physical, chart, labs and discussed the procedure including the risks, benefits and alternatives for the proposed anesthesia with the patient or authorized representative who has indicated his/her understanding and acceptance.     Plan Discussed with:   Anesthesia Plan Comments: (NPO appropriate, good working epidural, will dose up for post partum tubal, no signs of bleeding, normal/easy vaginal delivery)        Anesthesia Quick Evaluation

## 2014-09-11 NOTE — Op Note (Signed)
Preoperative diagnosis: Multiparity, permanent sterilization desired Postoperative diagnosis: Same Procedure: Postpartum bilateral tubal sterilization with Modified Pomeroy's method.  Surgeon: Dr Azucena Fallen, MD Assistants: none Anesthesia: Epidural  IV fluids LR 200 cc EBL minimal- 5 cc Urine clear: 200 cc in foley Complications none Disposition PACU, then postpartum floor Specimens cut segments of both fallopian tubes  Procedure Patient is 39 yo, G3 P3, spontaneous vaginal delivery todayat 8.50 am. She desires permanent sterilization via tubal ligation. All options of contraception and sterilization were reviewed including IUDs, Essure, as well as vasectomy. Patient declined other options. Risk and complications of surgery including infection, bleeding, damage to internal organs, other complications including pneumonia, VTE were reviewed. Also discussed irreversibility as well as failure and risk of ectopic pregnancy. Patient voiced understanding. Informed written consent was obtained and patient was brought to the operating room with IV running. Timeout was carried out. She underwent bolus in her labor epidural catheter without problems and level was adequate. She was in supine position with slight tendelenberg. Fundus was at the umbilicus. She was prepped and draped in standard fashion. Foley catheter was placed.  3 cc 0.25% Marcaine injected in the lower edge of the umbilicus. Skin incised with knife after tenting it up with Allis clamps. Fascia dissected, grasped with hemostats and incised and extended laterally. Posterior rectus sheath/peritoneum grasped and cut after thin window created. Peritoneal entry was uneventful. Patient tilted to the right and left fallopian tube identified, grasped, travelled to the fimbria and mid to lateral portion that has relative clear window in mesosalpinx was grasped with Babcock and 2-0 Plain gut ties x2 placed under the loop, knuckle of the tube cut and  sent to path, hemostasis good. Remaining tied tube released in the peritoneal cavity. Patient tilted to her left, right fallopian tube identified, walked to the fimbrial end and mid to lateral segment grasped with babcock, 2-0 plain gut ties x2 tied and knuckle cut off, sent to path, tied ends appeared hemostatic and returned to the peritoneal cavity. Hemostasis excellent.  Peritoneum and fascia grasped and sutured with 0-Vicryl. Skin closed in subcuticular fashion with 4-0 Vicryl. Sterile dressing placed. All counts were correct x2. No complications.  Patient brought out to the recovery room in stable condition and will be transferred to postpartum unit.   V.Alan Drummer, MD

## 2014-09-11 NOTE — Anesthesia Preprocedure Evaluation (Signed)
Anesthesia Evaluation  Patient identified by MRN, date of birth, ID band Patient awake    Reviewed: Allergy & Precautions, NPO status , Patient's Chart, lab work & pertinent test results  History of Anesthesia Complications Negative for: history of anesthetic complications  Airway Mallampati: II  TM Distance: >3 FB Neck ROM: Full    Dental no notable dental hx. (+) Dental Advisory Given   Pulmonary neg pulmonary ROS,  breath sounds clear to auscultation  Pulmonary exam normal       Cardiovascular negative cardio ROS Normal cardiovascular examRhythm:Regular Rate:Normal     Neuro/Psych negative neurological ROS  negative psych ROS   GI/Hepatic negative GI ROS, Neg liver ROS,   Endo/Other  diabetes, Oral Hypoglycemic Agents  Renal/GU negative Renal ROS  negative genitourinary   Musculoskeletal negative musculoskeletal ROS (+)   Abdominal   Peds negative pediatric ROS (+)  Hematology  (+) anemia ,   Anesthesia Other Findings   Reproductive/Obstetrics (+) Pregnancy                             Anesthesia Physical Anesthesia Plan  ASA: II  Anesthesia Plan: Epidural   Post-op Pain Management:    Induction:   Airway Management Planned:   Additional Equipment:   Intra-op Plan:   Post-operative Plan:   Informed Consent: I have reviewed the patients History and Physical, chart, labs and discussed the procedure including the risks, benefits and alternatives for the proposed anesthesia with the patient or authorized representative who has indicated his/her understanding and acceptance.     Plan Discussed with:   Anesthesia Plan Comments:         Anesthesia Quick Evaluation

## 2014-09-11 NOTE — Progress Notes (Signed)
Kayliah Tindol is a 39 y.o. G3P2002 at 22w1dby ultrasound admitted for active labor, class B DM, on Insulin. Prior rapid labor, so admitted at 3 cm and is s/p epidural.   Objective: BP 99/56 mmHg  Pulse 73  Temp(Src) 98.2 F (36.8 C) (Oral)  Resp 20  Ht 5' 2"  (1.575 m)  Wt 148 lb (67.132 kg)  BMI 27.06 kg/m2  SpO2 98%   FHT:  FHR: 125 bpm, variability: moderate,  accelerations:  Present,  decelerations:  Absent UC:   regular, every 3 minutes SVE:   Dilation: 5 Effacement (%): 60 Station: -2 Exam by:: Dr. MBenjie KarvonenAROM, clear. Unflexed high station with hematuria noted. Pt flat on her back pretty much all night, RN needs to proactively turn pt due to epidural.  Plan Peanut ball.   Labs: Lab Results  Component Value Date   WBC 11.5* 09/10/2014   HGB 11.1* 09/10/2014   HCT 32.6* 09/10/2014   MCV 84.0 09/10/2014   PLT 265 09/10/2014    Assessment / Plan: Protracted active phase, AROM. Epidural, need to turn pt in exaggerated Sims to help flex and descent head. Reviewed with RN,  GBS(-).  Anticipated MOD:  NSVD  Libra Gatz R 09/11/2014, 7:33 AM

## 2014-09-12 ENCOUNTER — Encounter (HOSPITAL_COMMUNITY): Payer: Self-pay | Admitting: Obstetrics and Gynecology

## 2014-09-12 DIAGNOSIS — D62 Acute posthemorrhagic anemia: Secondary | ICD-10-CM

## 2014-09-12 HISTORY — DX: Acute posthemorrhagic anemia: D62

## 2014-09-12 LAB — CBC
HCT: 29.9 % — ABNORMAL LOW (ref 36.0–46.0)
Hemoglobin: 9.8 g/dL — ABNORMAL LOW (ref 12.0–15.0)
MCH: 27.8 pg (ref 26.0–34.0)
MCHC: 32.8 g/dL (ref 30.0–36.0)
MCV: 84.7 fL (ref 78.0–100.0)
PLATELETS: 245 10*3/uL (ref 150–400)
RBC: 3.53 MIL/uL — ABNORMAL LOW (ref 3.87–5.11)
RDW: 14.4 % (ref 11.5–15.5)
WBC: 12.9 10*3/uL — ABNORMAL HIGH (ref 4.0–10.5)

## 2014-09-12 LAB — TYPE AND SCREEN
ABO/RH(D): AB POS
ANTIBODY SCREEN: NEGATIVE

## 2014-09-12 LAB — GLUCOSE, CAPILLARY
GLUCOSE-CAPILLARY: 110 mg/dL — AB (ref 65–99)
Glucose-Capillary: 85 mg/dL (ref 65–99)

## 2014-09-12 MED ORDER — POLYSACCHARIDE IRON COMPLEX 150 MG PO CAPS: 150.0000 mg | ORAL_CAPSULE | Freq: Every day | ORAL | Status: DC

## 2014-09-12 MED ORDER — IBUPROFEN 600 MG PO TABS: 600.0000 mg | ORAL_TABLET | Freq: Four times a day (QID) | ORAL | Status: DC

## 2014-09-12 MED ORDER — MAGNESIUM OXIDE 400 (241.3 MG) MG PO TABS: 200.0000 mg | ORAL_TABLET | Freq: Every day | ORAL | Status: DC

## 2014-09-12 MED ORDER — MAGNESIUM OXIDE 400 (241.3 MG) MG PO TABS
200.0000 mg | ORAL_TABLET | Freq: Every day | ORAL | Status: DC
  Filled 2014-09-12 (×2): qty 0.5

## 2014-09-12 NOTE — Anesthesia Postprocedure Evaluation (Signed)
  Anesthesia Post-op Note  Patient: Kelly Mullen  Procedure(s) Performed: * No procedures listed *  Patient Location: Mother/Baby  Anesthesia Type:Epidural  Level of Consciousness: awake, alert , oriented and patient cooperative  Airway and Oxygen Therapy: Patient Spontanous Breathing  Post-op Pain: none  Post-op Assessment: Post-op Vital signs reviewed, Patient's Cardiovascular Status Stable, Respiratory Function Stable, Patent Airway, No headache, No backache and Patient able to bend at knees  Post-op Vital Signs: Reviewed and stable  Last Vitals:  Filed Vitals:   09/12/14 0612  BP: 110/72  Pulse: 63  Temp: 36.4 C  Resp: 20    Complications: No apparent anesthesia complications

## 2014-09-12 NOTE — Discharge Instructions (Signed)
Breast Pumping Tips If you are breastfeeding, there may be times when you cannot feed your baby directly. Returning to work or going on a trip are common examples. Pumping allows you to store breast milk and feed it to your baby later.  You may not get much milk when you first start to pump. Your breasts should start to make more after a few days. If you pump at the times you usually feed your baby, you may be able to keep making enough milk to feed your baby without also using formula. The more often you pump, the more milk you will produce. WHEN SHOULD I PUMP?   You can begin to pump soon after delivery. However, some experts recommend waiting about 4 weeks before giving your infant a bottle to make sure breastfeeding is going well.  If you plan to return to work, begin pumping a few weeks before. This will help you develop techniques that work best for you. It also lets you build up a supply of breast milk.   When you are with your infant, feed on demand and pump after each feeding.   When you are away from your infant for several hours, pump for about 15 minutes every 2-3 hours. Pump both breasts at the same time if you can.   If your infant has a formula feeding, make sure to pump around the same time.   If you drink any alcohol, wait 2 hours before pumping.  HOW DO I PREPARE TO PUMP? Your let-down reflexis the natural reaction to stimulation that makes your breast milk flow. It is easier to stimulate this reflex when you are relaxed. Find relaxation techniques that work for you. If you have difficulty with your let-down reflex, try these methods:   Smell one of your infant's blankets or an item of clothing.   Look at a picture or video of your infant.   Sit in a quiet, private space.   Massage the breast you plan to pump.   Place soothing warmth on the breast.   Play relaxing music.  WHAT ARE SOME GENERAL BREAST PUMPING TIPS?  Wash your hands before you pump. You  do not need to wash your nipples or breasts.  There are three ways to pump.  You can use your hand to massage and compress your breast.  You can use a handheld manual pump.  You can use an electric pump.   Make sure the suction cup (flange) on the breast pump is the right size. Place the flange directly over the nipple. If it is the wrong size or placed the wrong way, it may be painful and cause nipple damage.   If pumping is uncomfortable, apply a small amount of purified or modified lanolin to your nipple and areola.  If you are using an electric pump, adjust the speed and suction power to be more comfortable.  If pumping is painful or if you are not getting very much milk, you may need a different type of pump. A lactation consultant can help you determine what type of pump to use.   Keep a full water bottle near you at all times. Drinking lots of fluid helps you make more milk.  You can store your milk to use later. Pumped breast milk can be stored in a sealable, sterile container or plastic bag. Label all stored breast milk with the date you pumped it.  Milk can stay out at room temperature for up to 8 hours.  You can store your milk in the refrigerator for up to 8 days.  You can store your milk in the freezer for 3 months. Thaw frozen milk using warm water. Do not put it in the microwave.  Do not smoke. Smoking can lower your milk supply and harm your infant. If you need help quitting, ask your health care provider to recommend a program.  [place] A LACTATION CONSULTANT?  You are having trouble pumping.  You are concerned that you are not making enough milk.  You have nipple pain, soreness, or redness.  You want to use birth control. Birth control pills may lower your milk supply. Talk to your health care provider about your options. Document Released: 06/21/2009 Document Revised: 01/06/2013 Document Reviewed:  10/24/2012 [facility] Patient Information 2015 [place], [place]. This information is not intended to replace advice given to you by your health care provider. Make sure you discuss any questions you have with your health care provider. Postpartum Depression and Baby Blues The postpartum period begins right after the birth of a baby. During this time, there is often a great amount of joy and excitement. It is also a time of many changes in the life of the parents. Regardless of how many times a mother gives birth, each child brings new challenges and dynamics to the family. It is not unusual to have feelings of excitement along with confusing shifts in moods, emotions, and thoughts. All mothers are at risk of developing postpartum depression or the "baby blues." These mood changes can occur right after giving birth, or they may occur many months after giving birth. The baby blues or postpartum depression can be mild or severe. Additionally, postpartum depression can go away rather quickly, or it can be a long-term condition.  CAUSES Raised hormone levels and the rapid drop in those levels are thought to be a main cause of postpartum depression and the baby blues. A number of hormones change during and after pregnancy. Estrogen and progesterone usually decrease right after the delivery of your baby. The levels of thyroid hormone and various cortisol steroids also rapidly drop. Other factors that play a role in these mood changes include major life events and genetics.  RISK FACTORS If you have any of the following risks for the baby blues or postpartum depression, know what symptoms to watch out for during the postpartum period. Risk factors that may increase the likelihood of getting the baby blues or postpartum depression include:  Having a personal or family history of depression.   Having depression while being pregnant.   Having premenstrual mood issues or mood issues related to oral  contraceptives.  Having a lot of life stress.   Having marital conflict.   Lacking a social support network.   Having a baby with special needs.   Having health problems, such as diabetes.  SIGNS AND SYMPTOMS Symptoms of baby blues include:  Brief changes in mood, such as going from extreme happiness to sadness.  Decreased concentration.   Difficulty sleeping.   Crying spells, tearfulness.   Irritability.   Anxiety.  Symptoms of postpartum depression typically begin within the first month after giving birth. These symptoms include:  Difficulty sleeping or excessive sleepiness.   Marked weight loss.   Agitation.   Feelings of worthlessness.   Lack of interest in activity or food.  Postpartum psychosis is a very serious condition and can be dangerous. Fortunately, it is rare. Displaying any of the  following symptoms is cause for immediate medical attention. Symptoms of postpartum psychosis include:   Hallucinations and delusions.   Bizarre or disorganized behavior.   Confusion or disorientation.  DIAGNOSIS  A diagnosis is made by an evaluation of your symptoms. There are no medical or lab tests that lead to a diagnosis, but there are various questionnaires that a health care provider may use to identify those with the baby blues, postpartum depression, or psychosis. Often, a screening tool called the [place] Postnatal Depression Scale is used to diagnose depression in the postpartum period.  TREATMENT The baby blues usually goes away on its own in 1-2 weeks. Social support is often all that is needed. You will be encouraged to get adequate sleep and rest. Occasionally, you may be given medicines to help you sleep.  Postpartum depression requires treatment because it can last several months or longer if it is not treated. Treatment may include individual or group therapy, medicine, or both to address any social, physiological, and psychological factors  that may play a role in the depression. Regular exercise, a healthy diet, rest, and social support may also be strongly recommended.  Postpartum psychosis is more serious and needs treatment right away. Hospitalization is often needed. HOME CARE INSTRUCTIONS  Get as much rest as you can. Nap when the baby sleeps.   Exercise regularly. Some women find yoga and walking to be beneficial.   Eat a balanced and nourishing diet.   Do little things that you enjoy. Have a cup of tea, take a bubble bath, read your favorite magazine, or listen to your favorite music.  Avoid alcohol.   Ask for help with household chores, cooking, grocery shopping, or running errands as needed. Do not try to do everything.   Talk to people close to you about how you are feeling. Get support from your partner, family members, friends, or other new moms.  Try to stay positive in how you think. Think about the things you are grateful for.   Do not spend a lot of time alone.   Only take over-the-counter or prescription medicine as directed by your health care provider.  Keep all your postpartum appointments.   Let your health care provider know if you have any concerns.  SEEK MEDICAL CARE IF: You are having a reaction to or problems with your medicine. SEEK IMMEDIATE MEDICAL CARE IF:  You have suicidal feelings.   You think you may harm the baby or someone else. MAKE SURE YOU:  Understand these instructions.  Will watch your condition.  Will get help right away if you are not doing well or get worse. Document Released: 10/06/2003 Document Revised: 01/06/2013 Document Reviewed: 10/13/2012 [facility] Patient Information 2015 [place], [place]. This information is not intended to replace advice given to you by your health care provider. Make sure you discuss any questions you have with your health care provider. Postpartum Tubal Ligation Care After Refer to this sheet in the next few weeks. These  instructions provide you with information on caring for yourself after your procedure. Your caregiver may also give you more specific instructions. Your treatment has been planned according to current medical practices, but problems sometimes occur. Call your caregiver if you have any problems or questions after your procedure. HOME CARE INSTRUCTIONS   Rest the remainder of the day.  Only take over-the-counter or prescription medicines for pain, discomfort, or fever as directed by your caregiver. Do not take aspirin. It can cause bleeding.  Gradually resume daily  activities, diet, rest, driving, and work.  Avoid sexual intercourse for 2 weeks or as directed.  Do not drive while taking pain medicine.  Do not lift anything over 5 pounds for 2 weeks or as directed.  Only take showers, not baths, until you are seen by your caregiver.  Change bandages (dressings) as directed.  Take your temperature twice a day and record it.  Try to have help for the first 7-10 days for your household needs.  Return to your caregiver to get your stitches (sutures) removed and for follow-up visits as directed. SEEK MEDICAL CARE IF:   You have redness, swelling, or increasing pain in the wound.  You have drainage from the wound lasting longer than 1 day.  Your pain is getting worse.  You have a rash.  You become dizzy or lightheaded.  You have a reaction to your medicine.  You need stronger medicine or a change in your pain medicine.  You notice a bad smell coming from the wound or dressing.  Your wound breaks open after the sutures have been removed.  You are constipated. SEEK IMMEDIATE MEDICAL CARE IF:   You faint.  You have a fever.  You have increasing abdominal pain.  You have severe pain in your shoulders.  You have bleeding or drainage from the suture sites or vagina following surgery.  You have shortness of breath or difficulty breathing.  You have chest or leg pain.  You  have persistent nausea, vomiting, or diarrhea. MAKE SURE YOU:   Understand these instructions.  Watch your condition.  Get help right away if you are not doing well or get worse. Document Released: 07/03/2011 Document Reviewed: 07/03/2011 [facility] Patient Information 2015 [place]. This information is not intended to replace advice given to you by your health care provider. Make sure you discuss any questions you have with your health care provider. Postpartum Care After Vaginal Delivery After you deliver your newborn (postpartum period), the usual stay in the hospital is 24-72 hours. If there were problems with your labor or delivery, or if you have other medical problems, you might be in the hospital longer.  While you are in the hospital, you will receive help and instructions on how to care for yourself and your newborn during the postpartum period.  While you are in the hospital:  Be sure to tell your nurses if you have pain or discomfort, as well as where you feel the pain and what makes the pain worse.  If you had an incision made near your vagina (episiotomy) or if you had some tearing during delivery, the nurses may put ice packs on your episiotomy or tear. The ice packs may help to reduce the pain and swelling.  If you are breastfeeding, you may feel uncomfortable contractions of your uterus for a couple of weeks. This is normal. The contractions help your uterus get back to normal size.  It is normal to have some bleeding after delivery.  For the first 1-3 days after delivery, the flow is red and the amount may be similar to a period.  It is common for the flow to start and stop.  In the first few days, you may pass some small clots. Let your nurses know if you begin to pass large clots or your flow increases.  Do not  flush blood clots down the toilet before having the nurse look at them.  During the next 3-10 days after delivery, your flow should become more watery  and pink or brown-tinged in color.  Ten to fourteen days after delivery, your flow should be a small amount of yellowish-white discharge.  The amount of your flow will decrease over the first few weeks after delivery. Your flow may stop in 6-8 weeks. Most women have had their flow stop by 12 weeks after delivery.  You should change your sanitary pads frequently.  Wash your hands thoroughly with soap and water for at least 20 seconds after changing pads, using the toilet, or before holding or feeding your newborn.  You should feel like you need to empty your bladder within the first 6-8 hours after delivery.  In case you become weak, lightheaded, or faint, call your nurse before you get out of bed for the first time and before you take a shower for the first time.  Within the first few days after delivery, your breasts may begin to feel tender and full. This is called engorgement. Breast tenderness usually goes away within 48-72 hours after engorgement occurs. You may also notice milk leaking from your breasts. If you are not breastfeeding, do not stimulate your breasts. Breast stimulation can make your breasts produce more milk.  Spending as much time as possible with your newborn is very important. During this time, you and your newborn can feel close and get to know each other. Having your newborn stay in your room (rooming in) will help to strengthen the bond with your newborn. It will give you time to get to know your newborn and become comfortable caring for your newborn.  Your hormones change after delivery. Sometimes the hormone changes can temporarily cause you to feel sad or tearful. These feelings should not last more than a few days. If these feelings last longer than that, you should talk to your caregiver.  If desired, talk to your caregiver about methods of family planning or contraception.  Talk to your caregiver about immunizations. Your caregiver may want you to have the  following immunizations before leaving the hospital:  Tetanus, diphtheria, and pertussis (Tdap) or tetanus and diphtheria (Td) immunization. It is very important that you and your family (including grandparents) or others caring for your newborn are up-to-date with the Tdap or Td immunizations. The Tdap or Td immunization can help protect your newborn from getting ill.  Rubella immunization.  Varicella (chickenpox) immunization.  Influenza immunization. You should receive this annual immunization if you did not receive the immunization during your pregnancy. Document Released: 10/29/2006 Document Revised: 09/26/2011 Document Reviewed: 08/29/2011 [facility] Patient Information 2015 [place], [place]. This information is not intended to replace advice given to you by your health care provider. Make sure you discuss any questions you have with your health care provider. Breastfeeding and Mastitis Mastitis is inflammation of the breast tissue. It can occur in women who are breastfeeding. This can make breastfeeding painful. Mastitis will sometimes go away on its own. Your health care provider will help determine if treatment is needed. CAUSES Mastitis is often associated with a blocked milk (lactiferous) duct. This can happen when too much milk builds up in the breast. Causes of excess milk in the breast can include:  Poor latch-on. If your baby is not latched onto the breast properly, she or he may not empty your breast completely while breastfeeding.  Allowing too much time to pass between feedings.  Wearing a bra or other clothing that is too tight. This puts extra pressure on the lactiferous ducts so milk does not flow through them as it  should. Mastitis can also be caused by a bacterial infection. Bacteria may enter the breast tissue through cuts or openings in the skin. In women who are breastfeeding, this may occur because of cracked or irritated skin. Cracks in the skin are often caused when  your baby does not latch on properly to the breast. SIGNS AND SYMPTOMS  Swelling, redness, tenderness, and pain in an area of the breast.  Swelling of the glands under the arm on the same side.  Fever may or may not accompany mastitis. If an infection is allowed to progress, a collection of pus (abscess) may develop. DIAGNOSIS  Your health care provider can usually diagnose mastitis based on your symptoms and a physical exam. Tests may be done to help confirm the diagnosis. These may include:  Removal of pus from the breast by applying pressure to the area. This pus can be examined in the lab to determine which bacteria are present. If an abscess has developed, the fluid in the abscess can be removed with a needle. This can also be used to confirm the diagnosis and determine the bacteria present. In most cases, pus will not be present.  Blood tests to determine if your body is fighting a bacterial infection.  Mammogram or ultrasound tests to rule out other problems or diseases. TREATMENT  Mastitis that occurs with breastfeeding will sometimes go away on its own. Your health care provider may choose to wait 24 hours after first seeing you to decide whether a prescription medicine is needed. If your symptoms are worse after 24 hours, your health care provider will likely prescribe an antibiotic medicine to treat the mastitis. He or she will determine which bacteria are most likely causing the infection and will then select an appropriate antibiotic medicine. This is sometimes changed based on the results of tests performed to identify the bacteria, or if there is no response to the antibiotic medicine selected. Antibiotic medicines are usually given by mouth. You may also be given medicine for pain. HOME CARE INSTRUCTIONS  Only take over-the-counter or prescription medicines for pain, fever, or discomfort as directed by your health care provider.  If your health care provider prescribed an  antibiotic medicine, take the medicine as directed. Make sure you finish it even if you start to feel better.  Do not wear a tight or underwire bra. Wear a soft, supportive bra.  Increase your fluid intake, especially if you have a fever.  Continue to empty the breast. Your health care provider can tell you whether this milk is safe for your infant or needs to be thrown out. You may be told to stop nursing until your health care provider thinks it is safe for your baby. Use a breast pump if you are advised to stop nursing.  Keep your nipples clean and dry.  Empty the first breast completely before going to the other breast. If your baby is not emptying your breasts completely for some reason, use a breast pump to empty your breasts.  If you go back to work, pump your breasts while at work to stay in time with your nursing schedule.  Avoid allowing your breasts to become overly filled with milk (engorged). SEEK MEDICAL CARE IF:  You have pus-like discharge from the breast.  Your symptoms do not improve with the treatment prescribed by your health care provider within 2 days. SEEK IMMEDIATE MEDICAL CARE IF:  Your pain and swelling are getting worse.  You have pain that is not controlled  with medicine.  You have a red line extending from the breast toward your armpit.  You have a fever or persistent symptoms for more than 2-3 days.  You have a fever and your symptoms suddenly get worse. MAKE SURE YOU:   Understand these instructions.  Will watch your condition.  Will get help right away if you are not doing well or get worse. Document Released: 04/28/2004 Document Revised: 01/06/2013 Document Reviewed: 08/07/2012 [facility] Patient Information 2015 [place], [place]. This information is not intended to replace advice given to you by your health care provider. Make sure you discuss any questions you have with your health care provider. Breastfeeding Deciding to breastfeed is one of  the best choices you can make for you and your baby. A change in hormones during pregnancy causes your breast tissue to grow and increases the number and size of your milk ducts. These hormones also allow proteins, sugars, and fats from your blood supply to make breast milk in your milk-producing glands. Hormones prevent breast milk from being released before your baby is born as well as prompt milk flow after birth. Once breastfeeding has begun, thoughts of your baby, as well as his or her sucking or crying, can stimulate the release of milk from your milk-producing glands.  BENEFITS OF BREASTFEEDING For Your Baby  Your first milk (colostrum) helps your baby's digestive system function better.   There are antibodies in your milk that help your baby fight off infections.   Your baby has a lower incidence of asthma, allergies, and sudden infant death syndrome.   The nutrients in breast milk are better for your baby than infant formulas and are designed uniquely for your baby's needs.   Breast milk improves your baby's brain development.   Your baby is less likely to develop other conditions, such as childhood obesity, asthma, or type 2 diabetes mellitus.  For You   Breastfeeding helps to create a very special bond between you and your baby.   Breastfeeding is convenient. Breast milk is always available at the correct temperature and costs nothing.   Breastfeeding helps to burn calories and helps you lose the weight gained during pregnancy.   Breastfeeding makes your uterus contract to its prepregnancy size faster and slows bleeding (lochia) after you give birth.   Breastfeeding helps to lower your risk of developing type 2 diabetes mellitus, osteoporosis, and breast or ovarian cancer later in life. SIGNS THAT YOUR BABY IS HUNGRY Early Signs of Hunger  Increased alertness or activity.  Stretching.  Movement of the head from side to side.  Movement of the head and opening  of the mouth when the corner of the mouth or cheek is stroked (rooting).  Increased sucking sounds, smacking lips, cooing, sighing, or squeaking.  Hand-to-mouth movements.  Increased sucking of fingers or hands. Late Signs of Hunger  Fussing.  Intermittent crying. Extreme Signs of Hunger Signs of extreme hunger will require calming and consoling before your baby will be able to breastfeed successfully. Do not wait for the following signs of extreme hunger to occur before you initiate breastfeeding:   Restlessness.  A loud, strong cry.   Screaming. BREASTFEEDING BASICS Breastfeeding Initiation  Find a comfortable place to sit or lie down, with your neck and back well supported.  Place a pillow or rolled up blanket under your baby to bring him or her to the level of your breast (if you are seated). Nursing pillows are specially designed to help support your arms and  your baby while you breastfeed.  Make sure that your baby's abdomen is facing your abdomen.   Gently massage your breast. With your fingertips, massage from your chest wall toward your nipple in a circular motion. This encourages milk flow. You may need to continue this action during the feeding if your milk flows slowly.  Support your breast with 4 fingers underneath and your thumb above your nipple. Make sure your fingers are well away from your nipple and your baby's mouth.   Stroke your baby's lips gently with your finger or nipple.   When your baby's mouth is open wide enough, quickly bring your baby to your breast, placing your entire nipple and as much of the colored area around your nipple (areola) as possible into your baby's mouth.   More areola should be visible above your baby's upper lip than below the lower lip.   Your baby's tongue should be between his or her lower gum and your breast.   Ensure that your baby's mouth is correctly positioned around your nipple (latched). Your baby's lips should  create a seal on your breast and be turned out (everted).  It is common for your baby to suck about 2-3 minutes in order to start the flow of breast milk. Latching Teaching your baby how to latch on to your breast properly is very important. An improper latch can cause nipple pain and decreased milk supply for you and poor weight gain in your baby. Also, if your baby is not latched onto your nipple properly, he or she may swallow some air during feeding. This can make your baby fussy. Burping your baby when you switch breasts during the feeding can help to get rid of the air. However, teaching your baby to latch on properly is still the best way to prevent fussiness from swallowing air while breastfeeding. Signs that your baby has successfully latched on to your nipple:    Silent tugging or silent sucking, without causing you pain.   Swallowing heard between every 3-4 sucks.    Muscle movement above and in front of his or her ears while sucking.  Signs that your baby has not successfully latched on to nipple:   Sucking sounds or smacking sounds from your baby while breastfeeding.  Nipple pain. If you think your baby has not latched on correctly, slip your finger into the corner of your baby's mouth to break the suction and place it between your baby's gums. Attempt breastfeeding initiation again. Signs of Successful Breastfeeding Signs from your baby:   A gradual decrease in the number of sucks or complete cessation of sucking.   Falling asleep.   Relaxation of his or her body.   Retention of a small amount of milk in his or her mouth.   Letting go of your breast by himself or herself. Signs from you:  Breasts that have increased in firmness, weight, and size 1-3 hours after feeding.   Breasts that are softer immediately after breastfeeding.  Increased milk volume, as well as a change in milk consistency and color by the fifth day of breastfeeding.   Nipples that are  not sore, cracked, or bleeding. Signs That Your Randel Books is Getting Enough Milk  Wetting at least 3 diapers in a 24-hour period. The urine should be clear and pale yellow by age 82 days.  At least 3 stools in a 24-hour period by age 82 days. The stool should be soft and yellow.  At least 3 stools in  a 24-hour period by age 331 days. The stool should be seedy and yellow.  No loss of weight greater than 10% of birth weight during the first 23 days of age.  Average weight gain of 4-7 ounces (113-198 g) per week after age 33 days.  Consistent daily weight gain by age 24 days, without weight loss after the age of 2 weeks. After a feeding, your baby may spit up a small amount. This is common. BREASTFEEDING FREQUENCY AND DURATION Frequent feeding will help you make more milk and can prevent sore nipples and breast engorgement. Breastfeed when you feel the need to reduce the fullness of your breasts or when your baby shows signs of hunger. This is called "breastfeeding on demand." Avoid introducing a pacifier to your baby while you are working to establish breastfeeding (the first 4-6 weeks after your baby is born). After this time you may choose to use a pacifier. Research has shown that pacifier use during the first year of a baby's life decreases the risk of sudden infant death syndrome (SIDS). Allow your baby to feed on each breast as long as he or she wants. Breastfeed until your baby is finished feeding. When your baby unlatches or falls asleep while feeding from the first breast, offer the second breast. Because newborns are often sleepy in the first few weeks of life, you may need to awaken your baby to get him or her to feed. Breastfeeding times will vary from baby to baby. However, the following rules can serve as a guide to help you ensure that your baby is properly fed:  Newborns (babies 51 weeks of age or younger) may breastfeed every 1-3 hours.  Newborns should not go longer than 3 hours during the  day or 5 hours during the night without breastfeeding.  You should breastfeed your baby a minimum of 8 times in a 24-hour period until you begin to introduce solid foods to your baby at around 45 months of age. BREAST MILK PUMPING Pumping and storing breast milk allows you to ensure that your baby is exclusively fed your breast milk, even at times when you are unable to breastfeed. This is especially important if you are going back to work while you are still breastfeeding or when you are not able to be present during feedings. Your lactation consultant can give you guidelines on how long it is safe to store breast milk.  A breast pump is a machine that allows you to pump milk from your breast into a sterile bottle. The pumped breast milk can then be stored in a refrigerator or freezer. Some breast pumps are operated by hand, while others use electricity. Ask your lactation consultant which type will work best for you. Breast pumps can be purchased, but some hospitals and breastfeeding support groups lease breast pumps on a monthly basis. A lactation consultant can teach you how to hand express breast milk, if you prefer not to use a pump.  CARING FOR YOUR BREASTS WHILE YOU BREASTFEED Nipples can become dry, cracked, and sore while breastfeeding. The following recommendations can help keep your breasts moisturized and healthy:  Avoid using soap on your nipples.   Wear a supportive bra. Although not required, special nursing bras and tank tops are designed to allow access to your breasts for breastfeeding without taking off your entire bra or top. Avoid wearing underwire-style bras or extremely tight bras.  Air dry your nipples for 3-19mnutes after each feeding.   Use only cotton bra  pads to absorb leaked breast milk. Leaking of breast milk between feedings is normal.   Use lanolin on your nipples after breastfeeding. Lanolin helps to maintain your skin's normal moisture barrier. If you use pure  lanolin, you do not need to wash it off before feeding your baby again. Pure lanolin is not toxic to your baby. You may also hand express a few drops of breast milk and gently massage that milk into your nipples and allow the milk to air dry. In the first few weeks after giving birth, some women experience extremely full breasts (engorgement). Engorgement can make your breasts feel heavy, warm, and tender to the touch. Engorgement peaks within 3-5 days after you give birth. The following recommendations can help ease engorgement:  Completely empty your breasts while breastfeeding or pumping. You may want to start by applying warm, moist heat (in the shower or with warm water-soaked hand towels) just before feeding or pumping. This increases circulation and helps the milk flow. If your baby does not completely empty your breasts while breastfeeding, pump any extra milk after he or she is finished.  Wear a snug bra (nursing or regular) or tank top for 1-2 days to signal your body to slightly decrease milk production.  Apply ice packs to your breasts, unless this is too uncomfortable for you.  Make sure that your baby is latched on and positioned properly while breastfeeding. If engorgement persists after 48 hours of following these recommendations, contact your health care provider or a Science writer. OVERALL HEALTH CARE RECOMMENDATIONS WHILE BREASTFEEDING  Eat healthy foods. Alternate between meals and snacks, eating 3 of each per day. Because what you eat affects your breast milk, some of the foods may make your baby more irritable than usual. Avoid eating these foods if you are sure that they are negatively affecting your baby.  Drink milk, fruit juice, and water to satisfy your thirst (about 10 glasses a day).   Rest often, relax, and continue to take your prenatal vitamins to prevent fatigue, stress, and anemia.  Continue breast self-awareness checks.  Avoid chewing and smoking  tobacco.  Avoid alcohol and drug use. Some medicines that may be harmful to your baby can pass through breast milk. It is important to ask your health care provider before taking any medicine, including all over-the-counter and prescription medicine as well as vitamin and herbal supplements. It is possible to become pregnant while breastfeeding. If birth control is desired, ask your health care provider about options that will be safe for your baby. SEEK MEDICAL CARE IF:   You feel like you want to stop breastfeeding or have become frustrated with breastfeeding.  You have painful breasts or nipples.  Your nipples are cracked or bleeding.  Your breasts are red, tender, or warm.  You have a swollen area on either breast.  You have a fever or chills.  You have nausea or vomiting.  You have drainage other than breast milk from your nipples.  Your breasts do not become full before feedings by the fifth day after you give birth.  You feel sad and depressed.  Your baby is too sleepy to eat well.  Your baby is having trouble sleeping.   Your baby is wetting less than 3 diapers in a 24-hour period.  Your baby has less than 3 stools in a 24-hour period.  Your baby's skin or the white part of his or her eyes becomes yellow.   Your baby is not gaining weight by  15 days of age. SEEK IMMEDIATE MEDICAL CARE IF:   Your baby is overly tired (lethargic) and does not want to wake up and feed.  Your baby develops an unexplained fever. Document Released: 01/01/2005 Document Revised: 01/06/2013 Document Reviewed: 06/25/2012 [facility] Patient Information 2015 [place], [place]. This information is not intended to replace advice given to you by your health care provider. Make sure you discuss any questions you have with your health care provider.

## 2014-09-12 NOTE — Anesthesia Postprocedure Evaluation (Signed)
  Anesthesia Post-op Note  Patient: Kelly Mullen  Procedure(s) Performed: Procedure(s): POST PARTUM TUBAL LIGATION (Bilateral)  Patient Location: Mother/Baby  Anesthesia Type:Epidural  Level of Consciousness: awake, alert , oriented and patient cooperative  Airway and Oxygen Therapy: Patient Spontanous Breathing  Post-op Pain: none  Post-op Assessment: Post-op Vital signs reviewed, Patient's Cardiovascular Status Stable, Respiratory Function Stable, Patent Airway, No headache, No backache and Patient able to bend at knees  Post-op Vital Signs: Reviewed and stable  Last Vitals:  Filed Vitals:   09/12/14 0612  BP: 110/72  Pulse: 63  Temp: 36.4 C  Resp: 20    Complications: No apparent anesthesia complications

## 2014-09-12 NOTE — Discharge Summary (Signed)
Obstetric Discharge Summary Reason for Admission: onset of labor Prenatal Procedures: ultrasound Intrapartum Procedures: spontaneous vaginal delivery Postpartum Procedures: none Complications-Operative and Postpartum: none HEMOGLOBIN  Date Value Ref Range Status  09/12/2014 9.8* 12.0 - 15.0 g/dL Final   HCT  Date Value Ref Range Status  09/12/2014 29.9* 36.0 - 46.0 % Final    Physical Exam:  General: alert, cooperative and no distress Lochia: appropriate Uterine Fundus: firm, midline, U-2 Incision: C/D/I; well-approximated with sutures Perineum: intact, no edema DVT Evaluation: No evidence of DVT seen on physical exam. Negative Homan's sign. No cords or calf tenderness. No significant calf/ankle edema.  Discharge Diagnoses: Term Pregnancy-delivered and S/P Laproscopic Bilateral Tubal Ligation  Discharge Information: Date: 09/12/2014 Activity: pelvic rest; remove Honeycomb dressing Thursday 09/16/2014 Diet: routine Medications: PNV, Ibuprofen and Iron Condition: stable Instructions: refer to practice specific booklet Discharge to: home Follow-up Information    Follow up with Jacelyn Pi, MD. Schedule an appointment as soon as possible for a visit in 2 weeks.   Specialty:  Endocrinology   Why:  For diabetic management   Contact information:   [address] [place] [address] [phone]       Newborn Data: Live born female on 09/11/2014 Birth Weight: 6 lb 13.4 oz (3100 g) APGAR: 8, 9  Home with mother.  Laury Deep, M MSN, CNM 09/12/2014, 11:01 AM

## 2014-09-12 NOTE — Progress Notes (Addendum)
Patient ID: Kelly Mullen, female   DOB: 12-04-1975, 39 y.o.   MRN: [id number] PPD # 1 SVD / S/P BTL  S:  Reports feeling well, ready for early discharge             Tolerating po/ No nausea or vomiting             Bleeding is light             Pain controlled with ibuprofen (OTC)             Up ad lib / ambulatory / voiding without difficulties    Newborn  Information for the patient's newborn:  Calli, Bashor [[id number]]  female  breast feeding    O:  A & O x 3, in no apparent distress              VS:  Filed Vitals:   09/11/14 1330 09/11/14 1500 09/11/14 1800 09/12/14 0612  BP: 110/76 117/64 118/68 110/72  Pulse: 69 81 77 63  Temp: 97.7 F (36.5 C) 98.1 F (36.7 C) 98.3 F (36.8 C) 97.5 F (36.4 C)  TempSrc: Oral Oral Oral Oral  Resp: 18 18 20 20   Height:      Weight:      SpO2:        LABS:  Recent Labs  09/10/14 2350 09/12/14 0527  WBC 11.5* 12.9*  HGB 11.1* 9.8*  HCT 32.6* 29.9*  PLT 265 245    Blood type: --/--/AB POS (08/26 2350)  Rubella: Immune (02/11 0000)   I&O: I/O last 3 completed shifts: In: 316 [I.V.:316] Out: 1015 [Urine:850; Blood:165]             Lungs: Clear and unlabored  Heart: regular rate and rhythm / no murmurs  Abdomen: soft, non-tender, non-distended              Fundus: firm, non-tender, U-2  Perineum: intact  Incision: healing, dried serous drainage on Honeycomb / skin well-approximated with sutures  Lochia: scant  Extremities: No edema, no calf pain or tenderness, No Homans    A/P: PPD # 1  39 y.o., [id number]   Principal Problem:    Postpartum care following vaginal delivery (8/27)  Active Problems:    Labor, precipitous    SVD (spontaneous vaginal delivery)    S/P tubal ligation    Acute blood loss anemia   Doing well - stable status  Routine post partum orders  Early D/C home today  Plan to remove abd dressing Thursday 9/1    Laury Deep, M, MSN, CNM 09/12/2014, 10:32 AM

## 2014-09-12 NOTE — Lactation Note (Signed)
This note was copied from the chart of [facility]. Lactation Consultation Note  Experienced BF mother denies any concerns.  Reports knowing how to hand express.  Referred mom to taking care of baby and me for engorgement relief if needed. Encouraged feeding baby with cues to prevent engorgement and bring milk to volume.  Informed of support groups and outpatients. Patient Name: Kelly Mullen [id number] Date: 09/12/2014 Reason for consult: Initial assessment   Maternal Data Has patient been taught Hand Expression?:  (Mother reports knowing how to hand express) Does the patient have breastfeeding experience prior to this delivery?: Yes  Feeding Feeding Type: Breast Fed Length of feed: 30 min  LATCH Score/Interventions                      Lactation Tools Discussed/Used     Consult Status      Van Clines 09/12/2014, 8:54 AM

## 2014-09-13 ENCOUNTER — Encounter (HOSPITAL_COMMUNITY): Payer: Self-pay | Admitting: Obstetrics & Gynecology

## 2014-09-27 NOTE — Addendum Note (Signed)
Addendum  created 09/27/14 0725 by Lauretta Grill, MD   Modules edited: Anesthesia Responsible Staff

## 2014-09-28 ENCOUNTER — Inpatient Hospital Stay (HOSPITAL_COMMUNITY): Payer: BLUE CROSS/BLUE SHIELD

## 2015-04-16 DIAGNOSIS — M531 Cervicobrachial syndrome: Secondary | ICD-10-CM | POA: Diagnosis not present

## 2015-04-16 DIAGNOSIS — M9901 Segmental and somatic dysfunction of cervical region: Secondary | ICD-10-CM | POA: Diagnosis not present

## 2015-04-16 DIAGNOSIS — M9902 Segmental and somatic dysfunction of thoracic region: Secondary | ICD-10-CM | POA: Diagnosis not present

## 2015-04-16 DIAGNOSIS — M791 Myalgia: Secondary | ICD-10-CM | POA: Diagnosis not present

## 2015-04-23 DIAGNOSIS — M9901 Segmental and somatic dysfunction of cervical region: Secondary | ICD-10-CM | POA: Diagnosis not present

## 2015-04-23 DIAGNOSIS — M791 Myalgia: Secondary | ICD-10-CM | POA: Diagnosis not present

## 2015-04-23 DIAGNOSIS — M9902 Segmental and somatic dysfunction of thoracic region: Secondary | ICD-10-CM | POA: Diagnosis not present

## 2015-04-23 DIAGNOSIS — M531 Cervicobrachial syndrome: Secondary | ICD-10-CM | POA: Diagnosis not present

## 2015-04-25 DIAGNOSIS — N751 Abscess of Bartholin's gland: Secondary | ICD-10-CM | POA: Diagnosis not present

## 2015-04-29 DIAGNOSIS — M531 Cervicobrachial syndrome: Secondary | ICD-10-CM | POA: Diagnosis not present

## 2015-04-29 DIAGNOSIS — M9901 Segmental and somatic dysfunction of cervical region: Secondary | ICD-10-CM | POA: Diagnosis not present

## 2015-04-29 DIAGNOSIS — M9902 Segmental and somatic dysfunction of thoracic region: Secondary | ICD-10-CM | POA: Diagnosis not present

## 2015-04-29 DIAGNOSIS — M791 Myalgia: Secondary | ICD-10-CM | POA: Diagnosis not present

## 2015-05-04 DIAGNOSIS — J0100 Acute maxillary sinusitis, unspecified: Secondary | ICD-10-CM | POA: Diagnosis not present

## 2015-05-07 DIAGNOSIS — M9901 Segmental and somatic dysfunction of cervical region: Secondary | ICD-10-CM | POA: Diagnosis not present

## 2015-05-07 DIAGNOSIS — M791 Myalgia: Secondary | ICD-10-CM | POA: Diagnosis not present

## 2015-05-07 DIAGNOSIS — M9902 Segmental and somatic dysfunction of thoracic region: Secondary | ICD-10-CM | POA: Diagnosis not present

## 2015-05-07 DIAGNOSIS — M531 Cervicobrachial syndrome: Secondary | ICD-10-CM | POA: Diagnosis not present

## 2015-05-10 DIAGNOSIS — B373 Candidiasis of vulva and vagina: Secondary | ICD-10-CM | POA: Diagnosis not present

## 2015-05-10 DIAGNOSIS — N751 Abscess of Bartholin's gland: Secondary | ICD-10-CM | POA: Diagnosis not present

## 2015-05-13 DIAGNOSIS — M791 Myalgia: Secondary | ICD-10-CM | POA: Diagnosis not present

## 2015-05-13 DIAGNOSIS — M9902 Segmental and somatic dysfunction of thoracic region: Secondary | ICD-10-CM | POA: Diagnosis not present

## 2015-05-13 DIAGNOSIS — M9901 Segmental and somatic dysfunction of cervical region: Secondary | ICD-10-CM | POA: Diagnosis not present

## 2015-05-13 DIAGNOSIS — M531 Cervicobrachial syndrome: Secondary | ICD-10-CM | POA: Diagnosis not present

## 2015-05-28 IMAGING — CR DG FOOT COMPLETE 3+V*L*
3 series · 3 of 3 positions shown · non-contrast
Comparison: None.

CLINICAL DATA: Infected insect bite.

EXAM:
LEFT FOOT - COMPLETE 3+ VIEW

[t foot ap left]
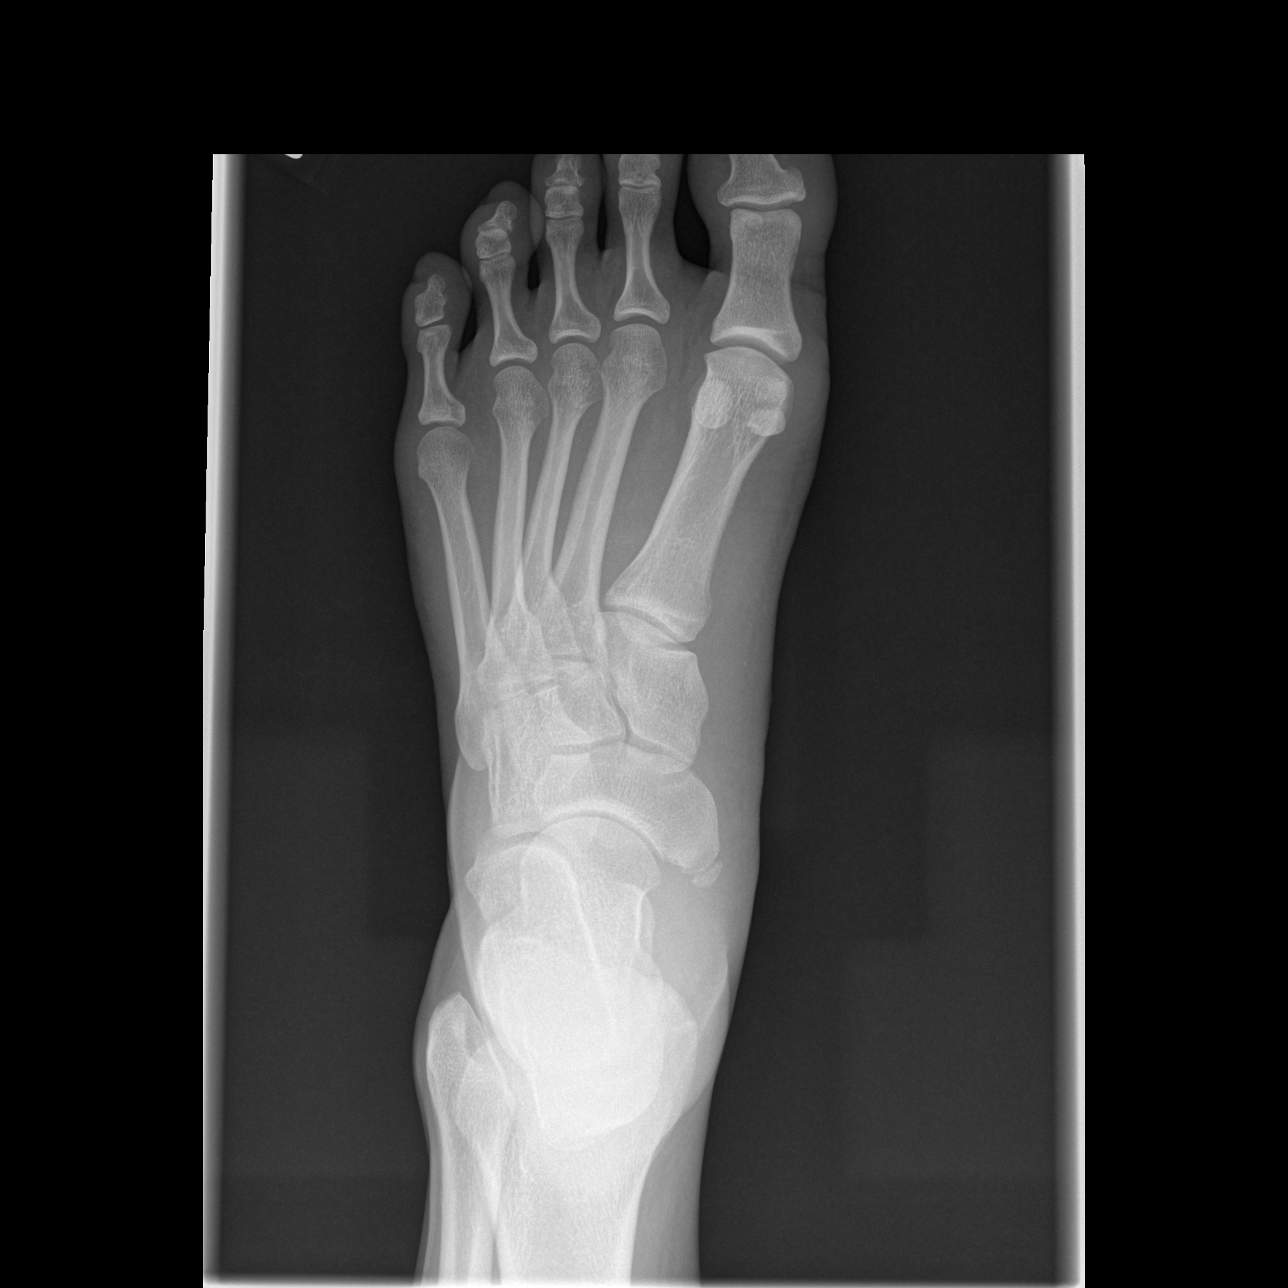

[t foot oblique left]
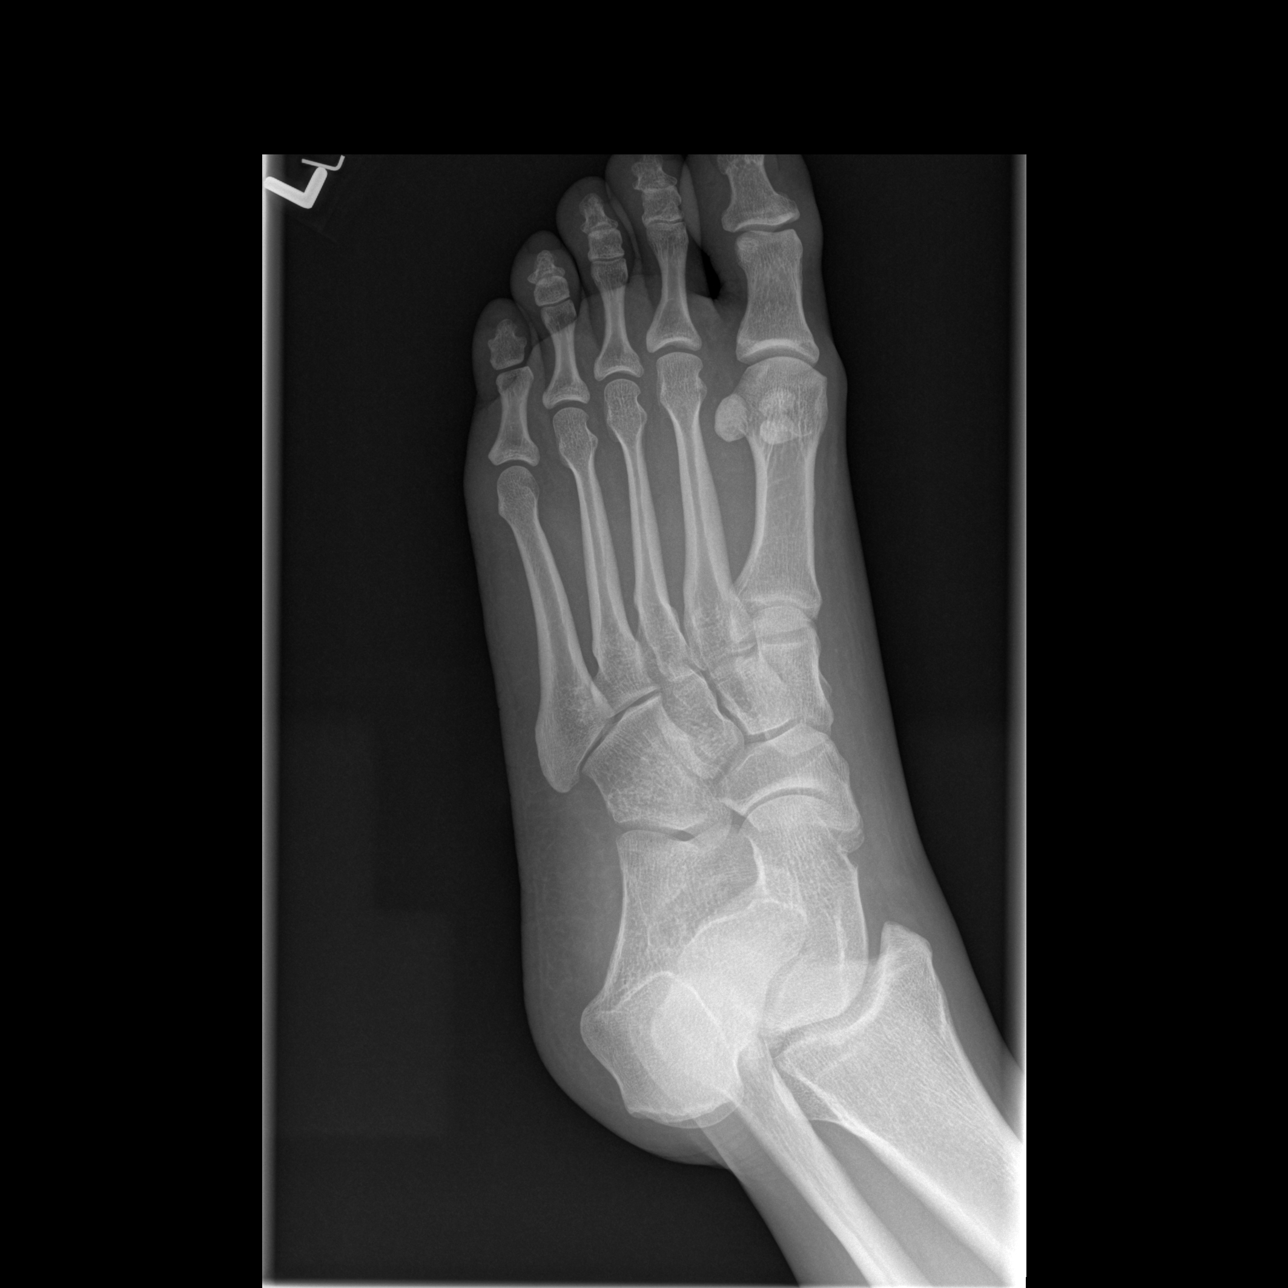

[t foot lat left]
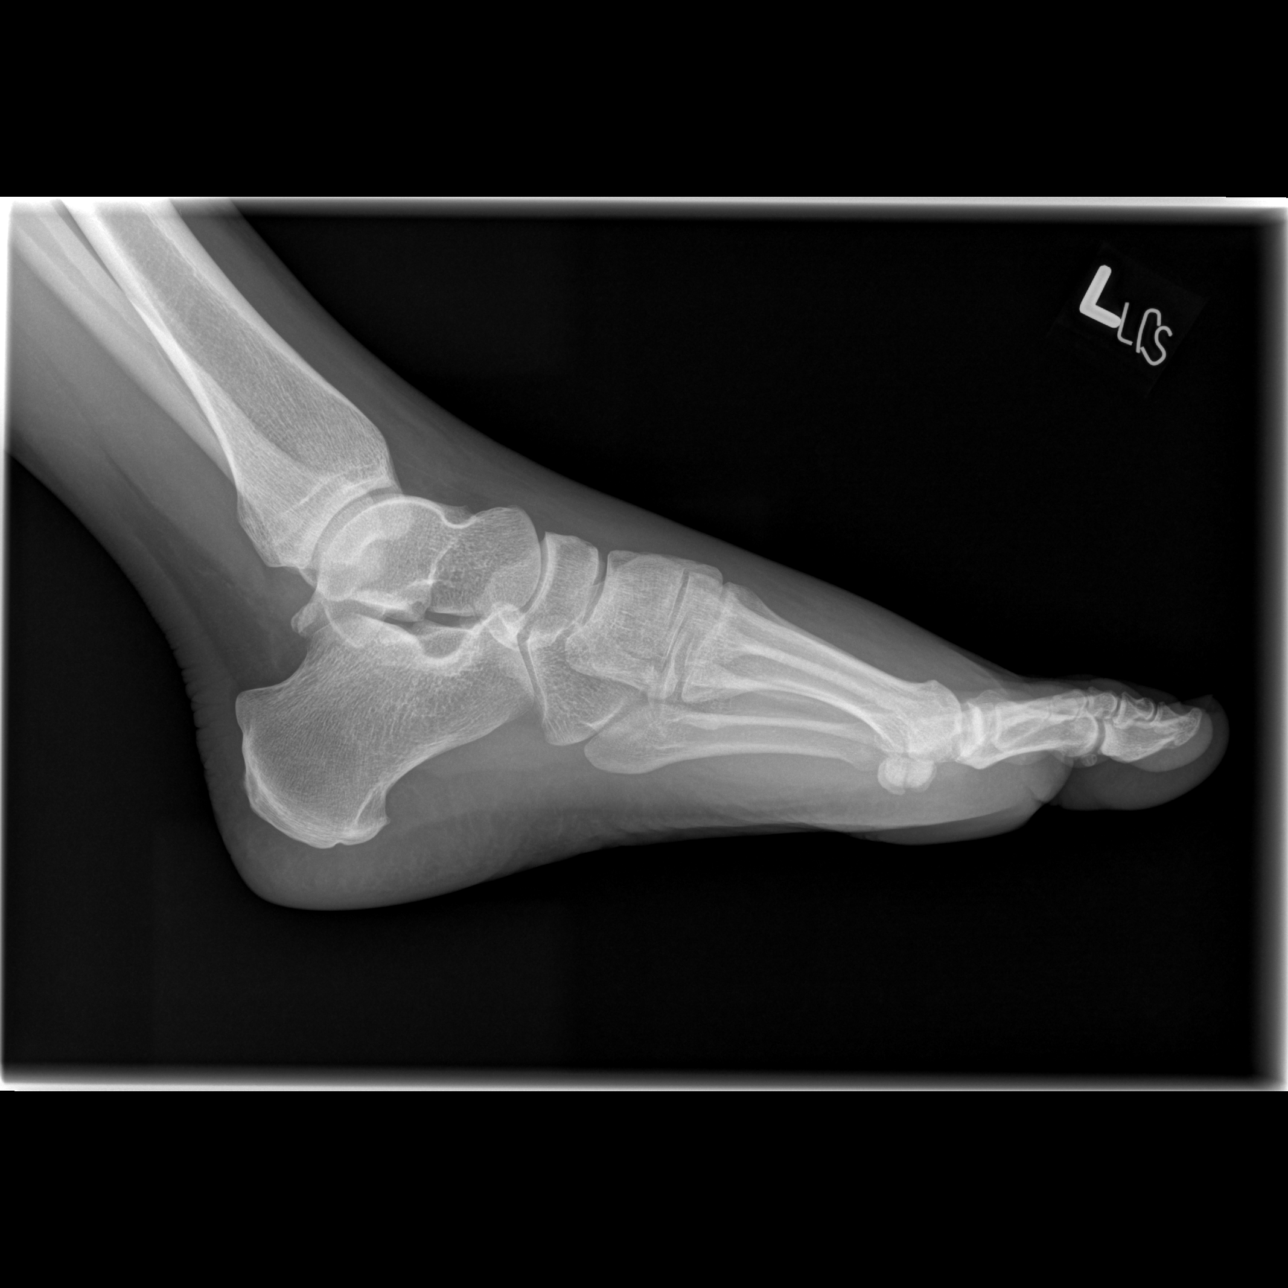

[3 of 3 positions shown; findings below may reference images not displayed]

FINDINGS: Soft tissue swelling medially. No osseous abnormality.
IMPRESSION: No signs of osteomyelitis or foreign body. Soft tissue swelling
medially.

## 2015-06-18 DIAGNOSIS — M9902 Segmental and somatic dysfunction of thoracic region: Secondary | ICD-10-CM | POA: Diagnosis not present

## 2015-06-18 DIAGNOSIS — M791 Myalgia: Secondary | ICD-10-CM | POA: Diagnosis not present

## 2015-06-18 DIAGNOSIS — M531 Cervicobrachial syndrome: Secondary | ICD-10-CM | POA: Diagnosis not present

## 2015-06-18 DIAGNOSIS — M9901 Segmental and somatic dysfunction of cervical region: Secondary | ICD-10-CM | POA: Diagnosis not present

## 2015-06-25 DIAGNOSIS — M9901 Segmental and somatic dysfunction of cervical region: Secondary | ICD-10-CM | POA: Diagnosis not present

## 2015-06-25 DIAGNOSIS — M791 Myalgia: Secondary | ICD-10-CM | POA: Diagnosis not present

## 2015-06-25 DIAGNOSIS — M531 Cervicobrachial syndrome: Secondary | ICD-10-CM | POA: Diagnosis not present

## 2015-06-25 DIAGNOSIS — M9902 Segmental and somatic dysfunction of thoracic region: Secondary | ICD-10-CM | POA: Diagnosis not present

## 2015-07-12 DIAGNOSIS — D649 Anemia, unspecified: Secondary | ICD-10-CM | POA: Diagnosis not present

## 2015-07-12 DIAGNOSIS — E119 Type 2 diabetes mellitus without complications: Secondary | ICD-10-CM | POA: Diagnosis not present

## 2015-08-06 DIAGNOSIS — M531 Cervicobrachial syndrome: Secondary | ICD-10-CM | POA: Diagnosis not present

## 2015-08-06 DIAGNOSIS — M9901 Segmental and somatic dysfunction of cervical region: Secondary | ICD-10-CM | POA: Diagnosis not present

## 2015-08-06 DIAGNOSIS — M791 Myalgia: Secondary | ICD-10-CM | POA: Diagnosis not present

## 2015-08-06 DIAGNOSIS — M9902 Segmental and somatic dysfunction of thoracic region: Secondary | ICD-10-CM | POA: Diagnosis not present

## 2015-08-30 DIAGNOSIS — M5386 Other specified dorsopathies, lumbar region: Secondary | ICD-10-CM | POA: Diagnosis not present

## 2015-08-30 DIAGNOSIS — M531 Cervicobrachial syndrome: Secondary | ICD-10-CM | POA: Diagnosis not present

## 2015-08-30 DIAGNOSIS — M9903 Segmental and somatic dysfunction of lumbar region: Secondary | ICD-10-CM | POA: Diagnosis not present

## 2015-08-30 DIAGNOSIS — M9904 Segmental and somatic dysfunction of sacral region: Secondary | ICD-10-CM | POA: Diagnosis not present

## 2015-09-02 DIAGNOSIS — M531 Cervicobrachial syndrome: Secondary | ICD-10-CM | POA: Diagnosis not present

## 2015-09-02 DIAGNOSIS — M5386 Other specified dorsopathies, lumbar region: Secondary | ICD-10-CM | POA: Diagnosis not present

## 2015-09-02 DIAGNOSIS — M9903 Segmental and somatic dysfunction of lumbar region: Secondary | ICD-10-CM | POA: Diagnosis not present

## 2015-09-02 DIAGNOSIS — M9904 Segmental and somatic dysfunction of sacral region: Secondary | ICD-10-CM | POA: Diagnosis not present

## 2015-09-05 DIAGNOSIS — R200 Anesthesia of skin: Secondary | ICD-10-CM | POA: Diagnosis not present

## 2015-09-07 DIAGNOSIS — M9903 Segmental and somatic dysfunction of lumbar region: Secondary | ICD-10-CM | POA: Diagnosis not present

## 2015-09-07 DIAGNOSIS — M531 Cervicobrachial syndrome: Secondary | ICD-10-CM | POA: Diagnosis not present

## 2015-09-07 DIAGNOSIS — M5386 Other specified dorsopathies, lumbar region: Secondary | ICD-10-CM | POA: Diagnosis not present

## 2015-09-07 DIAGNOSIS — M9904 Segmental and somatic dysfunction of sacral region: Secondary | ICD-10-CM | POA: Diagnosis not present

## 2015-09-21 DIAGNOSIS — G5602 Carpal tunnel syndrome, left upper limb: Secondary | ICD-10-CM | POA: Diagnosis not present

## 2015-09-29 DIAGNOSIS — D2372 Other benign neoplasm of skin of left lower limb, including hip: Secondary | ICD-10-CM | POA: Diagnosis not present

## 2015-09-29 DIAGNOSIS — L723 Sebaceous cyst: Secondary | ICD-10-CM | POA: Diagnosis not present

## 2015-09-29 DIAGNOSIS — L818 Other specified disorders of pigmentation: Secondary | ICD-10-CM | POA: Diagnosis not present

## 2015-10-05 DIAGNOSIS — R200 Anesthesia of skin: Secondary | ICD-10-CM | POA: Diagnosis not present

## 2015-11-02 DIAGNOSIS — R200 Anesthesia of skin: Secondary | ICD-10-CM | POA: Diagnosis not present

## 2015-11-11 DIAGNOSIS — M9904 Segmental and somatic dysfunction of sacral region: Secondary | ICD-10-CM | POA: Diagnosis not present

## 2015-11-11 DIAGNOSIS — E7800 Pure hypercholesterolemia, unspecified: Secondary | ICD-10-CM | POA: Diagnosis not present

## 2015-11-11 DIAGNOSIS — M9903 Segmental and somatic dysfunction of lumbar region: Secondary | ICD-10-CM | POA: Diagnosis not present

## 2015-11-11 DIAGNOSIS — M5386 Other specified dorsopathies, lumbar region: Secondary | ICD-10-CM | POA: Diagnosis not present

## 2015-11-11 DIAGNOSIS — E119 Type 2 diabetes mellitus without complications: Secondary | ICD-10-CM | POA: Diagnosis not present

## 2015-11-11 DIAGNOSIS — M531 Cervicobrachial syndrome: Secondary | ICD-10-CM | POA: Diagnosis not present

## 2015-11-11 DIAGNOSIS — R110 Nausea: Secondary | ICD-10-CM | POA: Diagnosis not present

## 2015-11-18 DIAGNOSIS — Z6823 Body mass index (BMI) 23.0-23.9, adult: Secondary | ICD-10-CM | POA: Diagnosis not present

## 2015-11-18 DIAGNOSIS — Z1231 Encounter for screening mammogram for malignant neoplasm of breast: Secondary | ICD-10-CM | POA: Diagnosis not present

## 2015-11-18 DIAGNOSIS — Z01419 Encounter for gynecological examination (general) (routine) without abnormal findings: Secondary | ICD-10-CM | POA: Diagnosis not present

## 2015-11-18 DIAGNOSIS — E119 Type 2 diabetes mellitus without complications: Secondary | ICD-10-CM | POA: Diagnosis not present

## 2015-11-21 DIAGNOSIS — J069 Acute upper respiratory infection, unspecified: Secondary | ICD-10-CM | POA: Diagnosis not present

## 2015-11-21 DIAGNOSIS — J029 Acute pharyngitis, unspecified: Secondary | ICD-10-CM | POA: Diagnosis not present

## 2015-11-23 DIAGNOSIS — J0140 Acute pansinusitis, unspecified: Secondary | ICD-10-CM | POA: Diagnosis not present

## 2015-11-26 DIAGNOSIS — Z91018 Allergy to other foods: Secondary | ICD-10-CM | POA: Diagnosis not present

## 2015-11-26 DIAGNOSIS — Z888 Allergy status to other drugs, medicaments and biological substances status: Secondary | ICD-10-CM | POA: Diagnosis not present

## 2015-11-26 DIAGNOSIS — R05 Cough: Secondary | ICD-10-CM | POA: Diagnosis not present

## 2015-11-26 DIAGNOSIS — J189 Pneumonia, unspecified organism: Secondary | ICD-10-CM | POA: Diagnosis not present

## 2015-11-26 DIAGNOSIS — J0140 Acute pansinusitis, unspecified: Secondary | ICD-10-CM | POA: Diagnosis not present

## 2015-11-26 DIAGNOSIS — Z79899 Other long term (current) drug therapy: Secondary | ICD-10-CM | POA: Diagnosis not present

## 2015-11-26 DIAGNOSIS — J0141 Acute recurrent pansinusitis: Secondary | ICD-10-CM | POA: Diagnosis not present

## 2015-11-26 DIAGNOSIS — Z794 Long term (current) use of insulin: Secondary | ICD-10-CM | POA: Diagnosis not present

## 2015-12-07 DIAGNOSIS — J189 Pneumonia, unspecified organism: Secondary | ICD-10-CM | POA: Diagnosis not present

## 2015-12-16 DIAGNOSIS — M9903 Segmental and somatic dysfunction of lumbar region: Secondary | ICD-10-CM | POA: Diagnosis not present

## 2015-12-16 DIAGNOSIS — M9904 Segmental and somatic dysfunction of sacral region: Secondary | ICD-10-CM | POA: Diagnosis not present

## 2015-12-16 DIAGNOSIS — M5386 Other specified dorsopathies, lumbar region: Secondary | ICD-10-CM | POA: Diagnosis not present

## 2015-12-16 DIAGNOSIS — M531 Cervicobrachial syndrome: Secondary | ICD-10-CM | POA: Diagnosis not present

## 2016-01-06 DIAGNOSIS — J209 Acute bronchitis, unspecified: Secondary | ICD-10-CM | POA: Diagnosis not present

## 2016-02-13 DIAGNOSIS — H5213 Myopia, bilateral: Secondary | ICD-10-CM | POA: Diagnosis not present

## 2016-02-13 DIAGNOSIS — H52223 Regular astigmatism, bilateral: Secondary | ICD-10-CM | POA: Diagnosis not present

## 2016-02-13 DIAGNOSIS — Z7984 Long term (current) use of oral hypoglycemic drugs: Secondary | ICD-10-CM | POA: Diagnosis not present

## 2016-02-13 DIAGNOSIS — E119 Type 2 diabetes mellitus without complications: Secondary | ICD-10-CM | POA: Diagnosis not present

## 2016-03-02 DIAGNOSIS — R062 Wheezing: Secondary | ICD-10-CM | POA: Diagnosis not present

## 2016-03-02 DIAGNOSIS — R05 Cough: Secondary | ICD-10-CM | POA: Diagnosis not present

## 2016-03-02 DIAGNOSIS — J410 Simple chronic bronchitis: Secondary | ICD-10-CM | POA: Diagnosis not present

## 2016-04-05 DIAGNOSIS — R300 Dysuria: Secondary | ICD-10-CM | POA: Diagnosis not present

## 2016-04-06 DIAGNOSIS — E119 Type 2 diabetes mellitus without complications: Secondary | ICD-10-CM | POA: Diagnosis not present

## 2016-04-09 DIAGNOSIS — Z0189 Encounter for other specified special examinations: Secondary | ICD-10-CM | POA: Diagnosis not present

## 2016-04-25 DIAGNOSIS — M9904 Segmental and somatic dysfunction of sacral region: Secondary | ICD-10-CM | POA: Diagnosis not present

## 2016-04-25 DIAGNOSIS — M5386 Other specified dorsopathies, lumbar region: Secondary | ICD-10-CM | POA: Diagnosis not present

## 2016-04-25 DIAGNOSIS — M9903 Segmental and somatic dysfunction of lumbar region: Secondary | ICD-10-CM | POA: Diagnosis not present

## 2016-04-25 DIAGNOSIS — M531 Cervicobrachial syndrome: Secondary | ICD-10-CM | POA: Diagnosis not present

## 2016-06-05 DIAGNOSIS — J028 Acute pharyngitis due to other specified organisms: Secondary | ICD-10-CM | POA: Diagnosis not present

## 2016-06-08 DIAGNOSIS — J069 Acute upper respiratory infection, unspecified: Secondary | ICD-10-CM | POA: Diagnosis not present

## 2016-07-20 DIAGNOSIS — M5386 Other specified dorsopathies, lumbar region: Secondary | ICD-10-CM | POA: Diagnosis not present

## 2016-07-20 DIAGNOSIS — M9903 Segmental and somatic dysfunction of lumbar region: Secondary | ICD-10-CM | POA: Diagnosis not present

## 2016-07-20 DIAGNOSIS — M9904 Segmental and somatic dysfunction of sacral region: Secondary | ICD-10-CM | POA: Diagnosis not present

## 2016-07-20 DIAGNOSIS — M531 Cervicobrachial syndrome: Secondary | ICD-10-CM | POA: Diagnosis not present

## 2016-08-24 DIAGNOSIS — E119 Type 2 diabetes mellitus without complications: Secondary | ICD-10-CM | POA: Diagnosis not present

## 2016-08-24 DIAGNOSIS — E7800 Pure hypercholesterolemia, unspecified: Secondary | ICD-10-CM | POA: Diagnosis not present

## 2016-08-25 DIAGNOSIS — G243 Spasmodic torticollis: Secondary | ICD-10-CM | POA: Diagnosis not present

## 2016-08-25 DIAGNOSIS — M5414 Radiculopathy, thoracic region: Secondary | ICD-10-CM | POA: Diagnosis not present

## 2016-08-25 DIAGNOSIS — M9902 Segmental and somatic dysfunction of thoracic region: Secondary | ICD-10-CM | POA: Diagnosis not present

## 2016-08-25 DIAGNOSIS — M9901 Segmental and somatic dysfunction of cervical region: Secondary | ICD-10-CM | POA: Diagnosis not present

## 2016-12-04 DIAGNOSIS — M9903 Segmental and somatic dysfunction of lumbar region: Secondary | ICD-10-CM | POA: Diagnosis not present

## 2016-12-04 DIAGNOSIS — M531 Cervicobrachial syndrome: Secondary | ICD-10-CM | POA: Diagnosis not present

## 2016-12-04 DIAGNOSIS — M9904 Segmental and somatic dysfunction of sacral region: Secondary | ICD-10-CM | POA: Diagnosis not present

## 2016-12-04 DIAGNOSIS — M5386 Other specified dorsopathies, lumbar region: Secondary | ICD-10-CM | POA: Diagnosis not present

## 2016-12-14 DIAGNOSIS — Z1231 Encounter for screening mammogram for malignant neoplasm of breast: Secondary | ICD-10-CM | POA: Diagnosis not present

## 2016-12-14 DIAGNOSIS — Z6823 Body mass index (BMI) 23.0-23.9, adult: Secondary | ICD-10-CM | POA: Diagnosis not present

## 2016-12-14 DIAGNOSIS — Z1151 Encounter for screening for human papillomavirus (HPV): Secondary | ICD-10-CM | POA: Diagnosis not present

## 2016-12-14 DIAGNOSIS — Z01419 Encounter for gynecological examination (general) (routine) without abnormal findings: Secondary | ICD-10-CM | POA: Diagnosis not present

## 2016-12-20 DIAGNOSIS — E119 Type 2 diabetes mellitus without complications: Secondary | ICD-10-CM | POA: Diagnosis not present

## 2016-12-28 DIAGNOSIS — M531 Cervicobrachial syndrome: Secondary | ICD-10-CM | POA: Diagnosis not present

## 2016-12-28 DIAGNOSIS — M9903 Segmental and somatic dysfunction of lumbar region: Secondary | ICD-10-CM | POA: Diagnosis not present

## 2016-12-28 DIAGNOSIS — M5386 Other specified dorsopathies, lumbar region: Secondary | ICD-10-CM | POA: Diagnosis not present

## 2016-12-28 DIAGNOSIS — M9904 Segmental and somatic dysfunction of sacral region: Secondary | ICD-10-CM | POA: Diagnosis not present

## 2017-02-15 DIAGNOSIS — H5213 Myopia, bilateral: Secondary | ICD-10-CM | POA: Diagnosis not present

## 2017-02-15 DIAGNOSIS — E119 Type 2 diabetes mellitus without complications: Secondary | ICD-10-CM | POA: Diagnosis not present

## 2017-02-15 DIAGNOSIS — H52223 Regular astigmatism, bilateral: Secondary | ICD-10-CM | POA: Diagnosis not present

## 2017-04-04 DIAGNOSIS — M9903 Segmental and somatic dysfunction of lumbar region: Secondary | ICD-10-CM | POA: Diagnosis not present

## 2017-04-04 DIAGNOSIS — M531 Cervicobrachial syndrome: Secondary | ICD-10-CM | POA: Diagnosis not present

## 2017-04-04 DIAGNOSIS — M5386 Other specified dorsopathies, lumbar region: Secondary | ICD-10-CM | POA: Diagnosis not present

## 2017-04-04 DIAGNOSIS — M9904 Segmental and somatic dysfunction of sacral region: Secondary | ICD-10-CM | POA: Diagnosis not present

## 2017-04-08 DIAGNOSIS — M542 Cervicalgia: Secondary | ICD-10-CM | POA: Diagnosis not present

## 2017-04-08 DIAGNOSIS — M4722 Other spondylosis with radiculopathy, cervical region: Secondary | ICD-10-CM | POA: Diagnosis not present

## 2017-04-19 DIAGNOSIS — M542 Cervicalgia: Secondary | ICD-10-CM | POA: Diagnosis not present

## 2017-04-23 DIAGNOSIS — M5013 Cervical disc disorder with radiculopathy, cervicothoracic region: Secondary | ICD-10-CM | POA: Diagnosis not present

## 2017-04-24 DIAGNOSIS — M5013 Cervical disc disorder with radiculopathy, cervicothoracic region: Secondary | ICD-10-CM | POA: Diagnosis not present

## 2017-04-30 DIAGNOSIS — M5013 Cervical disc disorder with radiculopathy, cervicothoracic region: Secondary | ICD-10-CM | POA: Diagnosis not present

## 2017-05-02 DIAGNOSIS — M5013 Cervical disc disorder with radiculopathy, cervicothoracic region: Secondary | ICD-10-CM | POA: Diagnosis not present

## 2017-06-05 DIAGNOSIS — E119 Type 2 diabetes mellitus without complications: Secondary | ICD-10-CM | POA: Diagnosis not present

## 2017-06-05 DIAGNOSIS — E7800 Pure hypercholesterolemia, unspecified: Secondary | ICD-10-CM | POA: Diagnosis not present

## 2017-08-13 DIAGNOSIS — B373 Candidiasis of vulva and vagina: Secondary | ICD-10-CM | POA: Diagnosis not present

## 2017-08-13 DIAGNOSIS — N898 Other specified noninflammatory disorders of vagina: Secondary | ICD-10-CM | POA: Diagnosis not present

## 2017-08-13 DIAGNOSIS — N760 Acute vaginitis: Secondary | ICD-10-CM | POA: Diagnosis not present

## 2017-10-11 DIAGNOSIS — F4323 Adjustment disorder with mixed anxiety and depressed mood: Secondary | ICD-10-CM | POA: Diagnosis not present

## 2017-10-24 DIAGNOSIS — F4323 Adjustment disorder with mixed anxiety and depressed mood: Secondary | ICD-10-CM | POA: Diagnosis not present

## 2017-11-08 DIAGNOSIS — F4323 Adjustment disorder with mixed anxiety and depressed mood: Secondary | ICD-10-CM | POA: Diagnosis not present

## 2017-11-22 DIAGNOSIS — F4323 Adjustment disorder with mixed anxiety and depressed mood: Secondary | ICD-10-CM | POA: Diagnosis not present

## 2017-11-29 DIAGNOSIS — R197 Diarrhea, unspecified: Secondary | ICD-10-CM | POA: Diagnosis not present

## 2017-11-29 DIAGNOSIS — E7800 Pure hypercholesterolemia, unspecified: Secondary | ICD-10-CM | POA: Diagnosis not present

## 2017-11-29 DIAGNOSIS — E119 Type 2 diabetes mellitus without complications: Secondary | ICD-10-CM | POA: Diagnosis not present

## 2017-11-29 DIAGNOSIS — R5383 Other fatigue: Secondary | ICD-10-CM | POA: Diagnosis not present

## 2017-12-10 DIAGNOSIS — F4323 Adjustment disorder with mixed anxiety and depressed mood: Secondary | ICD-10-CM | POA: Diagnosis not present

## 2017-12-17 DIAGNOSIS — Z01419 Encounter for gynecological examination (general) (routine) without abnormal findings: Secondary | ICD-10-CM | POA: Diagnosis not present

## 2017-12-17 DIAGNOSIS — Z6823 Body mass index (BMI) 23.0-23.9, adult: Secondary | ICD-10-CM | POA: Diagnosis not present

## 2017-12-17 DIAGNOSIS — Z1231 Encounter for screening mammogram for malignant neoplasm of breast: Secondary | ICD-10-CM | POA: Diagnosis not present

## 2018-01-23 DIAGNOSIS — M9903 Segmental and somatic dysfunction of lumbar region: Secondary | ICD-10-CM | POA: Diagnosis not present

## 2018-01-23 DIAGNOSIS — M531 Cervicobrachial syndrome: Secondary | ICD-10-CM | POA: Diagnosis not present

## 2018-01-23 DIAGNOSIS — M5386 Other specified dorsopathies, lumbar region: Secondary | ICD-10-CM | POA: Diagnosis not present

## 2018-01-23 DIAGNOSIS — M9904 Segmental and somatic dysfunction of sacral region: Secondary | ICD-10-CM | POA: Diagnosis not present

## 2018-02-17 DIAGNOSIS — H5213 Myopia, bilateral: Secondary | ICD-10-CM | POA: Diagnosis not present

## 2018-02-17 DIAGNOSIS — H52223 Regular astigmatism, bilateral: Secondary | ICD-10-CM | POA: Diagnosis not present

## 2018-02-17 DIAGNOSIS — Z7984 Long term (current) use of oral hypoglycemic drugs: Secondary | ICD-10-CM | POA: Diagnosis not present

## 2018-02-17 DIAGNOSIS — E119 Type 2 diabetes mellitus without complications: Secondary | ICD-10-CM | POA: Diagnosis not present

## 2018-04-07 DIAGNOSIS — E119 Type 2 diabetes mellitus without complications: Secondary | ICD-10-CM | POA: Diagnosis not present

## 2018-08-07 DIAGNOSIS — E119 Type 2 diabetes mellitus without complications: Secondary | ICD-10-CM | POA: Diagnosis not present

## 2018-09-04 DIAGNOSIS — L218 Other seborrheic dermatitis: Secondary | ICD-10-CM | POA: Diagnosis not present

## 2018-09-09 DIAGNOSIS — B354 Tinea corporis: Secondary | ICD-10-CM | POA: Diagnosis not present

## 2018-09-12 DIAGNOSIS — L218 Other seborrheic dermatitis: Secondary | ICD-10-CM | POA: Diagnosis not present

## 2018-10-02 DIAGNOSIS — B356 Tinea cruris: Secondary | ICD-10-CM | POA: Diagnosis not present

## 2018-10-02 DIAGNOSIS — R21 Rash and other nonspecific skin eruption: Secondary | ICD-10-CM | POA: Diagnosis not present

## 2018-11-12 DIAGNOSIS — L219 Seborrheic dermatitis, unspecified: Secondary | ICD-10-CM | POA: Diagnosis not present

## 2018-12-08 DIAGNOSIS — B359 Dermatophytosis, unspecified: Secondary | ICD-10-CM | POA: Diagnosis not present

## 2018-12-08 DIAGNOSIS — E119 Type 2 diabetes mellitus without complications: Secondary | ICD-10-CM | POA: Diagnosis not present

## 2018-12-24 DIAGNOSIS — L700 Acne vulgaris: Secondary | ICD-10-CM | POA: Diagnosis not present

## 2018-12-24 DIAGNOSIS — L219 Seborrheic dermatitis, unspecified: Secondary | ICD-10-CM | POA: Diagnosis not present

## 2019-01-01 DIAGNOSIS — E119 Type 2 diabetes mellitus without complications: Secondary | ICD-10-CM | POA: Diagnosis not present

## 2019-01-06 DIAGNOSIS — Z1231 Encounter for screening mammogram for malignant neoplasm of breast: Secondary | ICD-10-CM | POA: Diagnosis not present

## 2019-01-06 DIAGNOSIS — Z6825 Body mass index (BMI) 25.0-25.9, adult: Secondary | ICD-10-CM | POA: Diagnosis not present

## 2019-01-06 DIAGNOSIS — Z01419 Encounter for gynecological examination (general) (routine) without abnormal findings: Secondary | ICD-10-CM | POA: Diagnosis not present

## 2019-03-03 DIAGNOSIS — E119 Type 2 diabetes mellitus without complications: Secondary | ICD-10-CM | POA: Diagnosis not present

## 2019-03-03 DIAGNOSIS — Z794 Long term (current) use of insulin: Secondary | ICD-10-CM | POA: Diagnosis not present

## 2019-03-03 DIAGNOSIS — H5213 Myopia, bilateral: Secondary | ICD-10-CM | POA: Diagnosis not present

## 2019-03-03 DIAGNOSIS — H52223 Regular astigmatism, bilateral: Secondary | ICD-10-CM | POA: Diagnosis not present

## 2019-03-17 DIAGNOSIS — E119 Type 2 diabetes mellitus without complications: Secondary | ICD-10-CM | POA: Diagnosis not present

## 2019-04-10 ENCOUNTER — Ambulatory Visit: Payer: BLUE CROSS/BLUE SHIELD | Attending: Internal Medicine

## 2019-04-10 DIAGNOSIS — Z23 Encounter for immunization: Secondary | ICD-10-CM

## 2019-04-10 NOTE — Progress Notes (Signed)
   Covid-19 Vaccination Clinic  Name:  Kelly Mullen    MRN: [id number] DOB: 01/08/1976  04/10/2019  [name] was observed post Covid-19 immunization for 15 minutes without incident. She was provided with Vaccine Information Sheet and instruction to access the V-Safe system.   [name] was instructed to call 911 with any severe reactions post vaccine: Marland Kitchen Difficulty breathing  . Swelling of face and throat  . A fast heartbeat  . A bad rash all over body  . Dizziness and weakness   Immunizations Administered    Name Date Dose VIS Date Route   Moderna COVID-19 Vaccine 04/10/2019  9:50 AM 0.5 mL 12/16/2018 Intramuscular   Manufacturer: Moderna   Lot: [id number]   [place]: [id number]

## 2019-05-13 ENCOUNTER — Other Ambulatory Visit: Payer: Self-pay

## 2019-05-13 ENCOUNTER — Ambulatory Visit: Payer: Managed Care, Other (non HMO) | Attending: Internal Medicine

## 2019-05-13 DIAGNOSIS — Z23 Encounter for immunization: Secondary | ICD-10-CM

## 2019-05-13 NOTE — Progress Notes (Signed)
   Covid-19 Vaccination Clinic  Name:  Kelly Mullen    MRN: [id number] DOB: November 03, 1975  05/13/2019  [name] was observed post Covid-19 immunization for 15 minutes without incident. She was provided with Vaccine Information Sheet and instruction to access the V-Safe system.   [name] was instructed to call 911 with any severe reactions post vaccine: Marland Kitchen Difficulty breathing  . Swelling of face and throat  . A fast heartbeat  . A bad rash all over body  . Dizziness and weakness   Immunizations Administered    Name Date Dose VIS Date Route   Moderna COVID-19 Vaccine 05/13/2019  8:32 AM 0.5 mL 12/2018 Intramuscular   Manufacturer: Moderna   Lot: [id number]   [place]: [id number]

## 2019-07-15 DIAGNOSIS — Z03818 Encounter for observation for suspected exposure to other biological agents ruled out: Secondary | ICD-10-CM | POA: Diagnosis not present

## 2019-07-15 DIAGNOSIS — Z20822 Contact with and (suspected) exposure to covid-19: Secondary | ICD-10-CM | POA: Diagnosis not present

## 2019-11-10 DIAGNOSIS — E119 Type 2 diabetes mellitus without complications: Secondary | ICD-10-CM | POA: Diagnosis not present

## 2019-11-10 DIAGNOSIS — E7800 Pure hypercholesterolemia, unspecified: Secondary | ICD-10-CM | POA: Diagnosis not present

## 2019-11-17 DIAGNOSIS — E119 Type 2 diabetes mellitus without complications: Secondary | ICD-10-CM | POA: Diagnosis not present

## 2019-11-17 DIAGNOSIS — E7800 Pure hypercholesterolemia, unspecified: Secondary | ICD-10-CM | POA: Diagnosis not present

## 2019-11-17 DIAGNOSIS — M791 Myalgia, unspecified site: Secondary | ICD-10-CM | POA: Diagnosis not present

## 2019-11-24 NOTE — Progress Notes (Signed)
     Subjective:    CC: Bilat lower leg pain  Patient referred by her endocrinologist Dr. Peggyann Juba, Wendy Poet, LAT, ATC, am serving as scribe for Dr. Lynne Leader.  HPI: Pt is a 44 y/o female presenting w/ c/o B lower leg / calf pain x approximately 8 months w/ no known MOI but she notes that it only bothers her when she walks up hills.  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 She locates her pain specifically to B mid-gastroc.  Pain is worse with activity and better with rest.  She denies significant night pain burning tingling twitching or cramping.  LE swelling: No LE weakness: No LE Numbness/tingling: yes in her calves and feet only when walking uphill Aggravating factors: walking up hills; Treatments tried: Biofreeze  Pertinent review of Systems: No fevers or chills  Relevant historical information: Moderately controlled diabetes with A1c 7.7 on continuous glucometer and insulin pump.  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   MSK: Bilateral calves normal-appearing with sutures small scar anterior lateral calf. Calves normal-appearing nontender normal motion normal strength. Pulses diminished posterior tibialis and dorsal pedis bilaterally. Normal gait.  Lab and Radiology Results Diagnostic Limited MSK Ultrasound of: Bilateral calf Right posterior calf normal-appearing with no hypoechoic fluid collection or obvious tendon or muscle injury. Left posterior calf normal.  No hypoechoic fluid collection or obvious tendon or muscle injury Impression: Normal posterior calf that MSK ultrasound    Impression and Recommendations:     Assessment and Plan: 44 y.o. female with bilateral calf pain ongoing for months worse with exertion.  Etiology is a bit unclear at this time. Most likely explanation is calf muscle dysfunction and weakness.  Will treat with compression sleeve and eccentric exercises. However differential does include claudication as well as myopathy or myalgia. Plan for ABI to rule out claudication.  She does have hyperlipidemia and diabetes and is at some bit of a risk for this condition.  Additionally she has somewhat diminished pulses.  If not improving following home exercise program and ABI normal we will proceed with metabolic work-up and x-rays.  Recheck in a month.  Return sooner if needed.   PDMP not reviewed this encounter. Orders Placed This Encounter  Procedures  . [place] LIMITED JOINT SPACE STRUCTURES LOW BILAT(NO LINKED CHARGES)    Order Specific Question:   Reason for Exam (SYMPTOM  OR DIAGNOSIS REQUIRED)    Answer:   B lower leg pain    Order Specific Question:   Preferred imaging location?    Answer:   [place]   No orders of the defined types were placed in this encounter.   Discussed warning signs or symptoms. Please see discharge instructions. Patient expresses understanding.   The above documentation has been reviewed and is accurate and complete Lynne Leader, M.D.

## 2019-11-25 ENCOUNTER — Other Ambulatory Visit: Payer: Self-pay

## 2019-11-25 ENCOUNTER — Ambulatory Visit: Payer: Self-pay

## 2019-11-25 ENCOUNTER — Encounter: Payer: Self-pay | Admitting: Family Medicine

## 2019-11-25 ENCOUNTER — Ambulatory Visit (INDEPENDENT_AMBULATORY_CARE_PROVIDER_SITE_OTHER): Payer: BC Managed Care – PPO | Admitting: Family Medicine

## 2019-11-25 VITALS — BP 104/72 | HR 70 | Ht 62.0 in | Wt 140.6 lb

## 2019-11-25 DIAGNOSIS — M79662 Pain in left lower leg: Secondary | ICD-10-CM | POA: Diagnosis not present

## 2019-11-25 DIAGNOSIS — M79661 Pain in right lower leg: Secondary | ICD-10-CM | POA: Diagnosis not present

## 2019-11-25 NOTE — Patient Instructions (Addendum)
Thank you for coming in today.  Plan for ABI blood flow test.  Plan for exercises we taught you here in clinic.  30 reps 2-3 x daily.   Plan for compression sleeve with exercise.  I recommend you obtained a compression sleeve to help with your joint problems. There are many options on the market however I recommend obtaining a full calf Body Helix compression sleeve.  You can find information (including how to appropriate measure yourself for sizing) can be found at www.Body http://www.lambert.com/.  Many of these products are health savings account (HSA) eligible.   You can use the compression sleeve at any time throughout the day but is most important to use while being active as well as for 2 hours post-activity.   It is appropriate to ice following activity with the compression sleeve in place.   Recheck in 1 month.  Let me know if this is not improving or if worsening. We can change things to include formal PT and get labs and even MRIs.

## 2019-12-07 ENCOUNTER — Telehealth: Payer: Self-pay | Admitting: Family Medicine

## 2019-12-07 DIAGNOSIS — M79661 Pain in right lower leg: Secondary | ICD-10-CM

## 2019-12-07 DIAGNOSIS — M79662 Pain in left lower leg: Secondary | ICD-10-CM

## 2019-12-07 NOTE — Telephone Encounter (Signed)
Patient called asking if the order for VAS [place] ABI WITH/WO TBI could be sent to [facility]. This would be closer to her than [place].

## 2019-12-08 NOTE — Telephone Encounter (Signed)
Done Please let [facility] location know

## 2019-12-08 NOTE — Telephone Encounter (Signed)
Christy contacted at [facility].  She will call the pt to schedule.

## 2019-12-15 ENCOUNTER — Encounter (INDEPENDENT_AMBULATORY_CARE_PROVIDER_SITE_OTHER): Payer: Self-pay

## 2019-12-17 ENCOUNTER — Other Ambulatory Visit: Payer: Self-pay

## 2019-12-17 ENCOUNTER — Ambulatory Visit (HOSPITAL_COMMUNITY)
Admission: RE | Admit: 2019-12-17 | Discharge: 2019-12-17 | Disposition: A | Discharge status: Home / Self Care | Payer: BC Managed Care – PPO | Source: Ambulatory Visit | Attending: Family Medicine | Admitting: Family Medicine

## 2019-12-17 DIAGNOSIS — M79662 Pain in left lower leg: Secondary | ICD-10-CM | POA: Diagnosis not present

## 2019-12-17 DIAGNOSIS — M79661 Pain in right lower leg: Secondary | ICD-10-CM | POA: Diagnosis not present

## 2019-12-17 NOTE — Progress Notes (Signed)
ABI shows no abnormality with blood flow into the leg.  You have normal blood flow.

## 2019-12-22 NOTE — Progress Notes (Signed)
Normal arterial blood flow in the legs

## 2020-01-01 DIAGNOSIS — Z23 Encounter for immunization: Secondary | ICD-10-CM | POA: Diagnosis not present

## 2020-01-05 NOTE — Progress Notes (Deleted)
   I, Peterson Lombard, LAT, ATC acting as a scribe for Lynne Leader, MD.  Georgean Spainhower is a 44 y.o. female who presents to [place] [facility] today for f/u bilateral lower leg pain. Pain has been ongoing for approx 9 months w/ no know MOI. Pt c/o increased pain when walking up hill. Pt has a PMHx of compartment syndrome as a teenager and had bilat fasciotomies of her ant lower leg compartment. Pt was last seen by Dr. Georgina Snell on 11/25/19 and was advised to use compression sleeves and do eccentric exercises and plan for ABI. Today, pt reports   Pertinent review of systems: ***  Relevant historical information: ***   Exam:  There were no vitals taken for this visit. General: Well Developed, well nourished, and in no acute distress.   MSK: ***    Lab and Radiology Results No results found for this or any previous visit (from the past 72 hour(s)). No results found.     Assessment and Plan: 44 y.o. female with ***   PDMP not reviewed this encounter. No orders of the defined types were placed in this encounter.  No orders of the defined types were placed in this encounter.    Discussed warning signs or symptoms. Please see discharge instructions. Patient expresses understanding.   ***

## 2020-01-06 ENCOUNTER — Ambulatory Visit: Payer: BC Managed Care – PPO | Admitting: Family Medicine

## 2020-02-13 DIAGNOSIS — R051 Acute cough: Secondary | ICD-10-CM | POA: Diagnosis not present

## 2020-02-15 DIAGNOSIS — J069 Acute upper respiratory infection, unspecified: Secondary | ICD-10-CM | POA: Diagnosis not present

## 2020-02-17 DIAGNOSIS — J069 Acute upper respiratory infection, unspecified: Secondary | ICD-10-CM | POA: Diagnosis not present

## 2020-02-17 DIAGNOSIS — R059 Cough, unspecified: Secondary | ICD-10-CM | POA: Diagnosis not present

## 2020-03-02 DIAGNOSIS — J9801 Acute bronchospasm: Secondary | ICD-10-CM | POA: Diagnosis not present

## 2020-03-02 DIAGNOSIS — J189 Pneumonia, unspecified organism: Secondary | ICD-10-CM | POA: Diagnosis not present

## 2020-04-06 DIAGNOSIS — N393 Stress incontinence (female) (male): Secondary | ICD-10-CM | POA: Diagnosis not present

## 2020-04-06 DIAGNOSIS — Z1231 Encounter for screening mammogram for malignant neoplasm of breast: Secondary | ICD-10-CM | POA: Diagnosis not present

## 2020-04-06 DIAGNOSIS — Z113 Encounter for screening for infections with a predominantly sexual mode of transmission: Secondary | ICD-10-CM | POA: Diagnosis not present

## 2020-04-06 DIAGNOSIS — N920 Excessive and frequent menstruation with regular cycle: Secondary | ICD-10-CM | POA: Diagnosis not present

## 2020-04-06 DIAGNOSIS — Z01419 Encounter for gynecological examination (general) (routine) without abnormal findings: Secondary | ICD-10-CM | POA: Diagnosis not present

## 2020-04-06 DIAGNOSIS — Z6825 Body mass index (BMI) 25.0-25.9, adult: Secondary | ICD-10-CM | POA: Diagnosis not present

## 2020-04-06 DIAGNOSIS — Z124 Encounter for screening for malignant neoplasm of cervix: Secondary | ICD-10-CM | POA: Diagnosis not present

## 2020-04-11 DIAGNOSIS — M7502 Adhesive capsulitis of left shoulder: Secondary | ICD-10-CM | POA: Diagnosis not present

## 2020-04-28 DIAGNOSIS — H524 Presbyopia: Secondary | ICD-10-CM | POA: Diagnosis not present

## 2020-04-28 DIAGNOSIS — Z794 Long term (current) use of insulin: Secondary | ICD-10-CM | POA: Diagnosis not present

## 2020-04-28 DIAGNOSIS — E119 Type 2 diabetes mellitus without complications: Secondary | ICD-10-CM | POA: Diagnosis not present

## 2020-04-28 DIAGNOSIS — H5213 Myopia, bilateral: Secondary | ICD-10-CM | POA: Diagnosis not present

## 2020-04-28 DIAGNOSIS — H52223 Regular astigmatism, bilateral: Secondary | ICD-10-CM | POA: Diagnosis not present

## 2020-05-18 DIAGNOSIS — E7800 Pure hypercholesterolemia, unspecified: Secondary | ICD-10-CM | POA: Diagnosis not present

## 2020-05-18 DIAGNOSIS — E119 Type 2 diabetes mellitus without complications: Secondary | ICD-10-CM | POA: Diagnosis not present

## 2020-10-21 DIAGNOSIS — E7800 Pure hypercholesterolemia, unspecified: Secondary | ICD-10-CM | POA: Diagnosis not present

## 2020-10-21 DIAGNOSIS — Z9641 Presence of insulin pump (external) (internal): Secondary | ICD-10-CM | POA: Diagnosis not present

## 2020-10-21 DIAGNOSIS — E119 Type 2 diabetes mellitus without complications: Secondary | ICD-10-CM | POA: Diagnosis not present

## 2020-11-06 DIAGNOSIS — Z7951 Long term (current) use of inhaled steroids: Secondary | ICD-10-CM | POA: Diagnosis not present

## 2020-11-06 DIAGNOSIS — R519 Headache, unspecified: Secondary | ICD-10-CM | POA: Diagnosis not present

## 2020-11-06 DIAGNOSIS — R0789 Other chest pain: Secondary | ICD-10-CM | POA: Diagnosis not present

## 2020-11-06 DIAGNOSIS — L231 Allergic contact dermatitis due to adhesives: Secondary | ICD-10-CM | POA: Diagnosis not present

## 2020-11-06 DIAGNOSIS — Z794 Long term (current) use of insulin: Secondary | ICD-10-CM | POA: Diagnosis not present

## 2020-11-06 DIAGNOSIS — E119 Type 2 diabetes mellitus without complications: Secondary | ICD-10-CM | POA: Diagnosis not present

## 2020-11-06 DIAGNOSIS — R079 Chest pain, unspecified: Secondary | ICD-10-CM | POA: Diagnosis not present

## 2020-11-11 DIAGNOSIS — G44039 Episodic paroxysmal hemicrania, not intractable: Secondary | ICD-10-CM | POA: Diagnosis not present

## 2020-11-11 DIAGNOSIS — E1165 Type 2 diabetes mellitus with hyperglycemia: Secondary | ICD-10-CM | POA: Diagnosis not present

## 2020-11-11 DIAGNOSIS — Z1211 Encounter for screening for malignant neoplasm of colon: Secondary | ICD-10-CM | POA: Diagnosis not present

## 2020-11-11 DIAGNOSIS — Z794 Long term (current) use of insulin: Secondary | ICD-10-CM | POA: Diagnosis not present

## 2020-11-27 DIAGNOSIS — Z1211 Encounter for screening for malignant neoplasm of colon: Secondary | ICD-10-CM | POA: Diagnosis not present

## 2020-11-29 DIAGNOSIS — G43009 Migraine without aura, not intractable, without status migrainosus: Secondary | ICD-10-CM | POA: Diagnosis not present

## 2020-12-02 LAB — COLOGUARD: COLOGUARD: NEGATIVE

## 2020-12-12 DIAGNOSIS — G43009 Migraine without aura, not intractable, without status migrainosus: Secondary | ICD-10-CM | POA: Diagnosis not present

## 2023-09-06 ENCOUNTER — Other Ambulatory Visit (HOSPITAL_COMMUNITY): Payer: Self-pay | Admitting: Endocrinology

## 2023-09-06 DIAGNOSIS — E119 Type 2 diabetes mellitus without complications: Secondary | ICD-10-CM

## 2023-10-11 ENCOUNTER — Ambulatory Visit (HOSPITAL_BASED_OUTPATIENT_CLINIC_OR_DEPARTMENT_OTHER)
Admission: RE | Admit: 2023-10-11 | Discharge: 2023-10-11 | Disposition: A | Discharge status: Home / Self Care | Payer: Self-pay | Source: Ambulatory Visit | Attending: Endocrinology | Admitting: Endocrinology

## 2023-10-11 DIAGNOSIS — E119 Type 2 diabetes mellitus without complications: Secondary | ICD-10-CM | POA: Insufficient documentation
